# Patient Record
Sex: Female | Born: 1990 | Race: White | Hispanic: No | Smoking: Current every day smoker
Health system: Southern US, Community
[De-identification: ages and names within clinical notes are randomized; demographics above are authoritative.]

## PROBLEM LIST (undated history)

## (undated) DIAGNOSIS — R7303 Prediabetes: Secondary | ICD-10-CM

## (undated) DIAGNOSIS — K589 Irritable bowel syndrome without diarrhea: Secondary | ICD-10-CM

## (undated) DIAGNOSIS — K5900 Constipation, unspecified: Secondary | ICD-10-CM

## (undated) DIAGNOSIS — F419 Anxiety disorder, unspecified: Secondary | ICD-10-CM

## (undated) DIAGNOSIS — K259 Gastric ulcer, unspecified as acute or chronic, without hemorrhage or perforation: Secondary | ICD-10-CM

## (undated) DIAGNOSIS — E282 Polycystic ovarian syndrome: Secondary | ICD-10-CM

## (undated) DIAGNOSIS — F332 Major depressive disorder, recurrent severe without psychotic features: Secondary | ICD-10-CM

## (undated) DIAGNOSIS — K219 Gastro-esophageal reflux disease without esophagitis: Secondary | ICD-10-CM

## (undated) DIAGNOSIS — N912 Amenorrhea, unspecified: Secondary | ICD-10-CM

## (undated) DIAGNOSIS — N926 Irregular menstruation, unspecified: Secondary | ICD-10-CM

## (undated) HISTORY — DX: Anxiety disorder, unspecified: F41.9

## (undated) HISTORY — DX: Irregular menstruation, unspecified: N92.6

## (undated) HISTORY — PX: CHOLECYSTECTOMY: SHX55

## (undated) HISTORY — DX: Amenorrhea, unspecified: N91.2

---
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
## 2008-02-02 ENCOUNTER — Other Ambulatory Visit: Admission: RE | Admit: 2008-02-02 | Discharge: 2008-02-02 | Payer: Self-pay | Admitting: Family Medicine

## 2011-04-01 ENCOUNTER — Encounter: Payer: Self-pay | Admitting: Physician Assistant

## 2011-04-01 ENCOUNTER — Ambulatory Visit: Payer: Self-pay | Admitting: Emergency Medicine

## 2011-04-01 VITALS — BP 112/80 | HR 93 | Temp 98.2°F | Resp 16 | Ht 60.5 in | Wt 172.0 lb

## 2011-04-01 DIAGNOSIS — G43909 Migraine, unspecified, not intractable, without status migrainosus: Secondary | ICD-10-CM

## 2011-04-01 DIAGNOSIS — G5710 Meralgia paresthetica, unspecified lower limb: Secondary | ICD-10-CM

## 2011-04-01 DIAGNOSIS — R200 Anesthesia of skin: Secondary | ICD-10-CM

## 2011-04-01 DIAGNOSIS — G5711 Meralgia paresthetica, right lower limb: Secondary | ICD-10-CM

## 2011-04-01 DIAGNOSIS — R209 Unspecified disturbances of skin sensation: Secondary | ICD-10-CM

## 2011-04-01 NOTE — Patient Instructions (Signed)
Meralgia Paresthetica  Meralgia paresthetica (MP) is a disorder characterized by tingling, numbness, and burning pain in the outer side of the thigh. It occurs in men more than women. MP is generally found in middle-aged or overweight people. Sometimes, the disorder may disappear. CAUSES The disorder is caused by a nerve in the thigh being squeezed (compressed). MP may be associated with tight clothing, pregnancy, diabetes, and being overweight (obese). SYMPTOMS  Tingling, numbness, and burning in the outer thigh.   An area of the skin may be painful and sensitive to the touch.  The symptoms often worsen after walking or standing. TREATMENT  Treatment is based on your symptoms and is mainly supportive. Treatment may include:  Wearing looser clothing.   Losing weight.   Avoiding prolonged standing or walking.   Taking medication.   Surgery if the pain is peristent or severe.  MP usually eases or disappears after treatment. Surgery is not always fully successful. Document Released: 01/09/2002 Document Revised: 10/01/2010 Document Reviewed: 01/19/2005 [facility] Patient Information 2012 [place], [place].

## 2011-04-01 NOTE — Progress Notes (Signed)
  Subjective:    Patient ID: Donna Cisneros, female    DOB: 01/02/1991, 21 y.o.   MRN: [id number]  Extremity Weakness  This is a new problem. The current episode started 1 to 4 weeks ago. There has been no history of extremity trauma. The problem occurs intermittently. The problem has been unchanged. Associated symptoms include numbness.      Review of Systems  Constitutional: Negative.   HENT: Negative.   Eyes: Negative.   Cardiovascular: Negative.   Gastrointestinal: Negative.   Musculoskeletal: Positive for extremity weakness. Negative for joint swelling, arthralgias and gait problem.  Neurological: Positive for numbness. Negative for seizures, light-headedness and headaches.       Objective:   Physical Exam  Constitutional: She is oriented to person, place, and time. She appears well-developed and well-nourished.  Neck: Neck supple. No JVD present. No tracheal deviation present. No thyromegaly present.  Cardiovascular: Normal rate and normal heart sounds.   Pulmonary/Chest: Breath sounds normal.  Lymphadenopathy:    She has no cervical adenopathy.  Neurological: She is alert and oriented to person, place, and time. She has normal reflexes. She displays normal reflexes. No cranial nerve deficit. She exhibits normal muscle tone. Coordination normal.       There is decreased sensation to fine touch lateral right mid to lower thigh.          Assessment & Plan:

## 2011-09-24 ENCOUNTER — Encounter (HOSPITAL_COMMUNITY): Payer: Self-pay | Admitting: Emergency Medicine

## 2011-09-24 ENCOUNTER — Emergency Department (HOSPITAL_COMMUNITY): Payer: BC Managed Care – PPO

## 2011-09-24 ENCOUNTER — Emergency Department (HOSPITAL_COMMUNITY)
Admission: EM | Admit: 2011-09-24 | Discharge: 2011-09-24 | Disposition: A | Discharge status: Home / Self Care | Payer: BC Managed Care – PPO | Attending: Emergency Medicine | Admitting: Emergency Medicine

## 2011-09-24 DIAGNOSIS — K219 Gastro-esophageal reflux disease without esophagitis: Secondary | ICD-10-CM | POA: Insufficient documentation

## 2011-09-24 DIAGNOSIS — F17200 Nicotine dependence, unspecified, uncomplicated: Secondary | ICD-10-CM | POA: Insufficient documentation

## 2011-09-24 DIAGNOSIS — R1013 Epigastric pain: Secondary | ICD-10-CM

## 2011-09-24 HISTORY — DX: Gastro-esophageal reflux disease without esophagitis: K21.9

## 2011-09-24 LAB — URINALYSIS, ROUTINE W REFLEX MICROSCOPIC
Ketones, ur: NEGATIVE mg/dL
Leukocytes, UA: NEGATIVE
Nitrite: NEGATIVE
pH: 5.5 (ref 5.0–8.0)

## 2011-09-24 LAB — CBC WITH DIFFERENTIAL/PLATELET
Basophils Absolute: 0.1 10*3/uL (ref 0.0–0.1)
Basophils Relative: 0 % (ref 0–1)
MCHC: 33.9 g/dL (ref 30.0–36.0)
Monocytes Absolute: 0.8 10*3/uL (ref 0.1–1.0)
Neutro Abs: 9.2 10*3/uL — ABNORMAL HIGH (ref 1.7–7.7)
Neutrophils Relative %: 69 % (ref 43–77)
Platelets: 396 10*3/uL (ref 150–400)
RDW: 12.7 % (ref 11.5–15.5)
WBC: 13.5 10*3/uL — ABNORMAL HIGH (ref 4.0–10.5)

## 2011-09-24 LAB — BASIC METABOLIC PANEL
Chloride: 108 mEq/L (ref 96–112)
Creatinine, Ser: 0.85 mg/dL (ref 0.50–1.10)
GFR calc Af Amer: >90 mL/min (ref >90)
Potassium: 3.9 mEq/L (ref 3.5–5.1)
Sodium: 142 mEq/L (ref 135–145)

## 2011-09-24 MED ORDER — HYDROCODONE-ACETAMINOPHEN 5-500 MG PO TABS: 1.0000 | ORAL_TABLET | Freq: Four times a day (QID) | ORAL | Status: AC | PRN

## 2011-09-24 MED ORDER — OMEPRAZOLE 20 MG PO CPDR: 20.0000 mg | DELAYED_RELEASE_CAPSULE | Freq: Two times a day (BID) | ORAL | Status: DC

## 2011-09-24 NOTE — ED Provider Notes (Signed)
History     CSN: [id number]  Arrival date & time 09/24/11  1320   First MD Initiated Contact with Patient 09/24/11 1740      Chief Complaint  Patient presents with  . Abdominal Pain    (Consider location/radiation/quality/duration/timing/severity/associated sxs/prior treatment) HPI Comments: Has been on meds for reflux in the past, now having more discomfort in the ruq, epigastrium.  This is worse with eating.  There are no fevers or chills.    Patient is a 21 y.o. female presenting with abdominal pain. The history is provided by the patient.  Abdominal Pain The primary symptoms of the illness include abdominal pain and nausea. The primary symptoms of the illness do not include fever, vomiting, diarrhea, dysuria, vaginal discharge or vaginal bleeding. Episode onset: 3 months ago. The onset of the illness was gradual. The problem has been gradually worsening.  The illness is associated with eating. The patient states that she believes she is currently not pregnant. The patient has not had a change in bowel habit. Symptoms associated with the illness do not include chills.    Past Medical History  Diagnosis Date  . Acid reflux     History reviewed. No pertinent past surgical history.  History reviewed. No pertinent family history.  History  Substance Use Topics  . Smoking status: Current Everyday Smoker  . Smokeless tobacco: Not on file  . Alcohol Use: Yes     occasional    OB History    Grav Para Term Preterm Abortions TAB SAB Ect Mult Living                  Review of Systems  Constitutional: Negative for fever and chills.  Gastrointestinal: Positive for nausea and abdominal pain. Negative for vomiting and diarrhea.  Genitourinary: Negative for dysuria, vaginal bleeding and vaginal discharge.  All other systems reviewed and are negative.    Allergies  Review of patient's allergies indicates no known allergies.  Home Medications   Current Outpatient Rx  Name  Route Sig Dispense Refill  . OVER THE COUNTER MEDICATION Oral Take 3 tablets by mouth at bedtime as needed. Coffee Cruda-for sleep    . RIZATRIPTAN BENZOATE 10 MG PO TBDP Oral Take 10 mg by mouth as needed. May repeat in 2 hours if needed      BP 120/71  Pulse 93  Temp 98.3 F (36.8 C) (Oral)  Resp 16  SpO2 98%  Physical Exam  Nursing note and vitals reviewed. Constitutional: She is oriented to person, place, and time. She appears well-developed and well-nourished. No distress.  HENT:  Head: Normocephalic and atraumatic.  Neck: Normal range of motion. Neck supple.  Cardiovascular: Normal rate and regular rhythm.  Exam reveals no gallop and no friction rub.   No murmur heard. Pulmonary/Chest: Effort normal and breath sounds normal. No respiratory distress. She has no wheezes.  Abdominal: Soft. Bowel sounds are normal. She exhibits no distension.       There is ttp in the epigastrium, ruq without rebound or guarding.    Musculoskeletal: Normal range of motion.  Neurological: She is alert and oriented to person, place, and time.  Skin: Skin is warm and dry. She is not diaphoretic.    ED Course  Procedures (including critical care time)  Labs Reviewed  CBC WITH DIFFERENTIAL - Abnormal; Notable for the following:    WBC 13.5 (*)     Neutro Abs 9.2 (*)     All other components within normal limits  BASIC METABOLIC PANEL - Abnormal; Notable for the following:    Glucose, Bld 101 (*)     All other components within normal limits  LIPASE, BLOOD  POCT PREGNANCY, URINE  URINALYSIS, ROUTINE W REFLEX MICROSCOPIC   No results found.   No diagnosis found.    MDM  The patient presents with epigastric and ruq pain.  The ultrasound is negative.  As she has been on zantac and no longer seems to be working, I will switch her to prilosec, to return prn if worsens.        Geoffery Lyons, MD 09/24/11 [id number]

## 2011-09-24 NOTE — ED Notes (Signed)
Pt c/o upper abd pain x 3 months that feels like worsening acid reflux; pt sts increased burping after eating

## 2013-01-12 ENCOUNTER — Other Ambulatory Visit: Payer: Self-pay | Admitting: Gastroenterology

## 2013-01-12 DIAGNOSIS — R1033 Periumbilical pain: Secondary | ICD-10-CM

## 2013-01-12 DIAGNOSIS — R112 Nausea with vomiting, unspecified: Secondary | ICD-10-CM

## 2013-01-24 ENCOUNTER — Encounter: Payer: Self-pay | Admitting: Gynecology

## 2013-01-24 ENCOUNTER — Ambulatory Visit (INDEPENDENT_AMBULATORY_CARE_PROVIDER_SITE_OTHER): Payer: BC Managed Care – PPO | Admitting: Gynecology

## 2013-01-24 VITALS — BP 108/68 | HR 64 | Resp 18 | Ht 60.75 in | Wt 176.0 lb

## 2013-01-24 DIAGNOSIS — N912 Amenorrhea, unspecified: Secondary | ICD-10-CM

## 2013-01-24 DIAGNOSIS — N926 Irregular menstruation, unspecified: Secondary | ICD-10-CM | POA: Insufficient documentation

## 2013-01-24 HISTORY — DX: Irregular menstruation, unspecified: N92.6

## 2013-01-24 HISTORY — DX: Amenorrhea, unspecified: N91.2

## 2013-01-24 NOTE — Progress Notes (Signed)
22 y.o. Married Caucasian female   [id number] referred form PCP for irregular menses.  Pt states she bled 10/13-6/14 mostly every day.  Variable flow-red.  Pt was not seen by MD due to no insurance at that time.  Pt reports lifelong history of irregular cycles- usually q75m, next and last cycle was October 2014.  Pt bled 5-7d, normal flow.  Pt has only used condoms for contraception.  Pt has never been pregnant.   Pt is  currently sexually active.  Married 2y. She reports not using condoms on a regular basis.  First sexual activity at 22 years old, 79 number of lifetime partners.   Pt reports 50# weight gain a few years ago but believes she has been mostly stable since.  She denies nipple discharge, headaches. Pt has had issues with excessive gas, indigestion.    Patient's last menstrual period was 11/02/2012.          Sexually active: yes  The current method of family planning is none.    Exercising: no  The patient does not participate in regular exercise at present. Last pap: 12/114 Negative  Alcohol: socially Tobacco:  5-7 cigs/day Drugs: no Gardisil: no, completed: started it   Labs: PCP   Health Maintenance  Topic Date Due  . Pap Smear  01/22/2007  . Tetanus/tdap  01/22/2008  . Influenza Vaccine  09/02/2012    Family History  Problem Relation Age of Onset  . Heart disease Maternal Grandfather     Patient Active Problem List   Diagnosis Date Noted  . Acid reflux   . Migraines 04/01/2011    Past Medical History  Diagnosis Date  . Acid reflux   . Anxiety     History reviewed. No pertinent past surgical history.  Allergies: Review of patient's allergies indicates no known allergies.  Current Outpatient Prescriptions  Medication Sig Dispense Refill  . FIBER PO Take by mouth.      . Probiotic Product (PROBIOTIC PO) Take by mouth.      . rizatriptan (MAXALT-MLT) 10 MG disintegrating tablet Take 10 mg by mouth as needed. May repeat in 2 hours if needed      . traMADol  (ULTRAM) 50 MG tablet Take by mouth every 6 (six) hours as needed.      Marland Kitchen omeprazole (PRILOSEC) 20 MG capsule Take 1 capsule (20 mg total) by mouth 2 (two) times daily.  30 capsule  1  . OVER THE COUNTER MEDICATION Take 3 tablets by mouth at bedtime as needed. Coffee Cruda-for sleep       No current facility-administered medications for this visit.    ROS: Pertinent items are noted in HPI.  Exam:    BP 108/68  Pulse 64  Resp 18  Ht 5' 0.75" (1.543 m)  Wt 176 lb (79.833 kg)  BMI 33.53 kg/m2  LMP 11/02/2012 Weight change: @WEIGHTCHANGE @ Last 3 height recordings:  Ht Readings from Last 3 Encounters:  01/24/13 5' 0.75" (1.543 m)  04/01/11 5' 0.5" (1.537 m)   General appearance: alert, cooperative and appears stated age Head: Normocephalic, without obvious abnormality, atraumatic Neck: no adenopathy, no carotid bruit, no JVD, supple, symmetrical, trachea midline and thyroid not enlarged, symmetric, no tenderness/mass/nodules Lungs: clear to auscultation bilaterally Breasts: normal appearance, no masses or tenderness Heart: regular rate and rhythm, S1, S2 normal, no murmur, click, rub or gallop Abdomen: soft, non-tender; bowel sounds normal; no masses,  no organomegaly Extremities: extremities normal, atraumatic, no cyanosis or edema Skin: Skin color, texture,  turgor normal. No rashes or lesions Lymph nodes: Cervical, supraclavicular, and axillary nodes normal. no inguinal nodes palpated Neurologic: Grossly normal   Pelvic: External genitalia:  no lesions              Urethra: normal appearing urethra with no masses, tenderness or lesions              Bartholins and Skenes: Bartholin's, Urethra, Skene's normal                 Vagina: normal appearing vagina with normal color and discharge, no lesions              Cervix: normal appearance              Pap taken: no        Bimanual Exam:  Uterus:  uterus is normal size, shape, consistency and nontender                                       Adnexa:    no masses                                      Rectovaginal: Deferred                                      Anus:  defer exam  A:Irregular menses with menometrorrhagia obesity Contraceptive management     P: records reviewed with pt and husband, of note pt has elevated TG and low HDL, in addition her fasting glucose is 91. Normal TSH We had a long discussion regarding her DUB, she was never evaluated for her prolonged bleeding of 72m last year but seems to have stopped without treatment.  We discussed the risk of endometrial dysplasia related to prolonged periods of anovulation.  We discussed PCOS and metabolic syndrome but have not confirmed either of these diagnoses. They are not interested in conception immediately but will be in the upcoming years. We will get a PUS, FSH/LH ratio, and quant.  Recommend using condoms for now so we can start ocp  We discussed treating with ocp after full evaluation Questions addressed Discussed STD prevention, regular condom use.     An After Visit Summary was printed and given to the patient.

## 2013-01-24 NOTE — Patient Instructions (Signed)
Use condoms for now until ocp can be started

## 2013-01-25 ENCOUNTER — Other Ambulatory Visit (INDEPENDENT_AMBULATORY_CARE_PROVIDER_SITE_OTHER): Payer: BC Managed Care – PPO

## 2013-01-25 DIAGNOSIS — N926 Irregular menstruation, unspecified: Secondary | ICD-10-CM

## 2013-01-25 LAB — FSH/LH: FSH: 4.5 m[IU]/mL

## 2013-01-25 LAB — ESTRADIOL: Estradiol: 59.4 pg/mL

## 2013-01-25 LAB — GLUCOSE, RANDOM: Glucose, Bld: 91 mg/dL (ref 70–99)

## 2013-01-26 LAB — INSULIN, FASTING: Insulin fasting, serum: 16 u[IU]/mL (ref 3–28)

## 2013-01-30 ENCOUNTER — Telehealth: Payer: Self-pay | Admitting: Gynecology

## 2013-01-30 NOTE — Telephone Encounter (Signed)
Patient returning call to [place]

## 2013-01-30 NOTE — Telephone Encounter (Signed)
No answer on home phone #, female voice on cell phone voicemail. No message left. Called patient to schedule PUS.

## 2013-01-31 ENCOUNTER — Ambulatory Visit (HOSPITAL_COMMUNITY)
Admission: RE | Admit: 2013-01-31 | Discharge: 2013-01-31 | Disposition: A | Discharge status: Home / Self Care | Payer: BC Managed Care – PPO | Source: Ambulatory Visit | Attending: Gastroenterology | Admitting: Gastroenterology

## 2013-01-31 DIAGNOSIS — R112 Nausea with vomiting, unspecified: Secondary | ICD-10-CM | POA: Insufficient documentation

## 2013-01-31 DIAGNOSIS — R1033 Periumbilical pain: Secondary | ICD-10-CM | POA: Insufficient documentation

## 2013-01-31 MED ORDER — TECHNETIUM TC 99M MEBROFENIN IV KIT
5.0000 | PACK | Freq: Once | INTRAVENOUS | Status: AC | PRN
  Administered 2013-01-31: 5 via INTRAVENOUS

## 2013-02-01 ENCOUNTER — Telehealth: Payer: Self-pay | Admitting: Gynecology

## 2013-02-01 NOTE — Telephone Encounter (Signed)
Spoke with patient. She states that she was calling to update Dr. Farrel Gobble. Has testing done yesterday and Gallbladder is not functioning properly per Patient. Testing was done yesterday ordered by Dr. Loreta Ave. She states that Dr. Kenna Gilbert office called her to let her know and they would be putting her in contact with the surgeon. Patient states she will call back with update.

## 2013-02-01 NOTE — Telephone Encounter (Signed)
Patient says she was told to call with info re: "stomach issues".

## 2013-02-07 ENCOUNTER — Ambulatory Visit (INDEPENDENT_AMBULATORY_CARE_PROVIDER_SITE_OTHER): Payer: BC Managed Care – PPO | Admitting: Gynecology

## 2013-02-07 ENCOUNTER — Encounter: Payer: Self-pay | Admitting: Gynecology

## 2013-02-07 ENCOUNTER — Ambulatory Visit (INDEPENDENT_AMBULATORY_CARE_PROVIDER_SITE_OTHER): Payer: BC Managed Care – PPO

## 2013-02-07 VITALS — BP 108/68 | HR 62 | Resp 16 | Ht 60.75 in | Wt 176.0 lb

## 2013-02-07 DIAGNOSIS — N926 Irregular menstruation, unspecified: Secondary | ICD-10-CM

## 2013-02-07 DIAGNOSIS — E282 Polycystic ovarian syndrome: Secondary | ICD-10-CM

## 2013-02-07 DIAGNOSIS — K829 Disease of gallbladder, unspecified: Secondary | ICD-10-CM

## 2013-02-07 NOTE — Progress Notes (Signed)
      Pt here with spouse to review u/s done for menstrual irregularities and to discuss her labs.  Images were reviewed.   Ovaries appear c/w PCOS, possible dominant follicle on left,lining 8.891mm, no free fluid. Pt's labs were notable for elevated LH 9.0, FSH 4.5, glucose normal 91 and insulin 16. We discussed PCOS at length.  Ideally she would benefit with ovarian suppression with ocp.  She was referred to GI for excessive burping at our last office visit and had a HEPA scan that shown decreased motility of gallbladder but no stones, she is being referred to gen surgery. We suggest that she use condoms for now as ocp may exacerbate her GB and that after surgery, we can consider starting her on ocp. Questions were addressed. 1466m spent, >50% face to face discussing PCOS

## 2013-02-10 ENCOUNTER — Encounter (INDEPENDENT_AMBULATORY_CARE_PROVIDER_SITE_OTHER): Payer: Self-pay | Admitting: Surgery

## 2013-02-27 ENCOUNTER — Encounter (INDEPENDENT_AMBULATORY_CARE_PROVIDER_SITE_OTHER): Payer: Self-pay

## 2013-02-27 ENCOUNTER — Encounter (INDEPENDENT_AMBULATORY_CARE_PROVIDER_SITE_OTHER): Payer: Self-pay | Admitting: Surgery

## 2013-02-27 ENCOUNTER — Ambulatory Visit (INDEPENDENT_AMBULATORY_CARE_PROVIDER_SITE_OTHER): Payer: BC Managed Care – PPO | Admitting: Surgery

## 2013-02-27 VITALS — BP 116/61 | HR 70 | Temp 98.6°F | Resp 18 | Ht 61.0 in | Wt 175.2 lb

## 2013-02-27 DIAGNOSIS — K828 Other specified diseases of gallbladder: Secondary | ICD-10-CM

## 2013-02-27 NOTE — Progress Notes (Signed)
Patient ID: Donna Cisneros, female   DOB: Dec 03, 1990, 22 y.o.   MRN: [id number]  Chief Complaint  Patient presents with  . Abdominal Pain    HPI Donna Cisneros is a 23 y.o. female.   HPI This is a pleasant female referred to me by Dr. Loreta Ave for multiple abdominal complaints. She has had severe reflux, burning epigastric abdominal pain, pain in her increase her back, occasional nausea and vomiting, etc. For many years. She has had ulcers in the past. She has had an extensive workup including an upper endoscopy. Her ultrasound has been negative in the past for stones. She has had anorexia and bulimia in the past according to her. Her symptoms occur with just about eating she eats. She also has intermittent loose bowel movements. Past Medical History  Diagnosis Date  . Acid reflux   . Anxiety   . Irregular menstrual cycle 01/24/2013  . Amenorrhea 01/24/2013    History reviewed. No pertinent past surgical history.  Family History  Problem Relation Age of Onset  . Heart disease Maternal Grandfather     Social History History  Substance Use Topics  . Smoking status: Current Every Day Smoker -- 0.25 packs/day    Types: Cigarettes  . Smokeless tobacco: Not on file  . Alcohol Use: No    No Known Allergies  Current Outpatient Prescriptions  Medication Sig Dispense Refill  . azithromycin (ZITHROMAX) 250 MG tablet       . FIBER PO Take by mouth.      . fluconazole (DIFLUCAN) 150 MG tablet       . OVER THE COUNTER MEDICATION Take 3 tablets by mouth at bedtime as needed. Coffee Cruda-for sleep      . pantoprazole (PROTONIX) 40 MG tablet       . Probiotic Product (PROBIOTIC PO) Take by mouth.      . promethazine (PHENERGAN) 25 MG tablet       . rizatriptan (MAXALT-MLT) 10 MG disintegrating tablet Take 10 mg by mouth as needed. May repeat in 2 hours if needed      . traMADol (ULTRAM) 50 MG tablet Take by mouth every 6 (six) hours as needed.      Marland Kitchen omeprazole (PRILOSEC) 20 MG  capsule Take 1 capsule (20 mg total) by mouth 2 (two) times daily.  30 capsule  1   No current facility-administered medications for this visit.    Review of Systems Review of Systems  Constitutional: Negative for fever, chills and unexpected weight change.  HENT: Negative for congestion, hearing loss, sore throat, trouble swallowing and voice change.   Eyes: Negative for visual disturbance.  Respiratory: Negative for cough and wheezing.   Cardiovascular: Negative for chest pain, palpitations and leg swelling.  Gastrointestinal: Positive for nausea, vomiting, abdominal pain, diarrhea and abdominal distention. Negative for constipation, blood in stool and anal bleeding.  Genitourinary: Negative for hematuria, vaginal bleeding and difficulty urinating.  Musculoskeletal: Negative for arthralgias.  Skin: Negative for rash and wound.  Neurological: Negative for seizures, syncope and headaches.  Hematological: Negative for adenopathy. Does not bruise/bleed easily.  Psychiatric/Behavioral: Negative for confusion.    Blood pressure 116/61, pulse 70, temperature 98.6 F (37 C), temperature source Temporal, resp. rate 18, height 5\' 1"  (1.549 m), weight 175 lb 3.2 oz (79.47 kg).  Physical Exam Physical Exam  Constitutional: She is oriented to person, place, and time. She appears well-developed and well-nourished. No distress.  obese  HENT:  Head: Normocephalic and atraumatic.  Right  Ear: External ear normal.  Left Ear: External ear normal.  Nose: Nose normal.  Mouth/Throat: Oropharynx is clear and moist. No oropharyngeal exudate.  Eyes: Conjunctivae are normal. Pupils are equal, round, and reactive to light. Right eye exhibits no discharge. Left eye exhibits no discharge. No scleral icterus.  Neck: Normal range of motion. Neck supple. No tracheal deviation present. No thyromegaly present.  Cardiovascular: Normal rate, regular rhythm, normal heart sounds and intact distal pulses.   No murmur  heard. Pulmonary/Chest: Effort normal and breath sounds normal. No respiratory distress. She has no wheezes. She has no rales.  Abdominal: Soft. Bowel sounds are normal. She exhibits no distension. There is no tenderness. There is no rebound.  Musculoskeletal: Normal range of motion. She exhibits no edema and no tenderness.  Lymphadenopathy:    She has no cervical adenopathy.  Neurological: She is alert and oriented to person, place, and time.  Skin: Skin is warm and dry. She is not diaphoretic. No erythema.  Psychiatric: Her behavior is normal.    Data Reviewed I have reviewed all her labs and x-ray data. Her gallbladder ejection fraction is 28%. Her upper endoscopy does show tiny gastric erosions  Assessment    Biliary dyskinesia     Plan    I do believe she has chronic cholecystitis which may be contributing to her abdominal complaints with the dyskinesia. Not all of her symptoms may be related to the gallbladder but I believe laparoscopic cholecystectomy is warranted. I discussed the surgical procedure with her in detail. I discussed the risks which includes but is not limited to bleeding, infection, bile duct injury, bile leak, injury to other structures, need to convert to an open procedure, the chance this may not resolve any of her symptoms, etc. She understands and wishes to proceed. I also discussed postoperative recovery. Cholecystectomy will be scheduled        Yazid Pop A 02/27/2013, 1:56 PM

## 2013-03-07 ENCOUNTER — Other Ambulatory Visit (INDEPENDENT_AMBULATORY_CARE_PROVIDER_SITE_OTHER): Payer: Self-pay | Admitting: Surgery

## 2013-03-07 ENCOUNTER — Other Ambulatory Visit (INDEPENDENT_AMBULATORY_CARE_PROVIDER_SITE_OTHER): Payer: Self-pay | Admitting: *Deleted

## 2013-03-07 ENCOUNTER — Telehealth (INDEPENDENT_AMBULATORY_CARE_PROVIDER_SITE_OTHER): Payer: Self-pay | Admitting: General Surgery

## 2013-03-07 DIAGNOSIS — K810 Acute cholecystitis: Secondary | ICD-10-CM

## 2013-03-07 DIAGNOSIS — K824 Cholesterolosis of gallbladder: Secondary | ICD-10-CM

## 2013-03-07 MED ORDER — HYDROCODONE-ACETAMINOPHEN 5-325 MG PO TABS: 1.0000 | ORAL_TABLET | ORAL | Status: DC | PRN

## 2013-03-07 NOTE — Telephone Encounter (Signed)
Pt called after undergoing LapChole today stating her right abdomen and part of side & just wanted to make sure that was normal. No f/c/n/v. Tolerating food. vicodin relieves pain. Explained that is typical complaint after surgery and should resolve with time. Advised to cont to take pain med as needed and call if symptoms worsen, don't get relief with pain med or if other issues come up

## 2013-03-14 ENCOUNTER — Telehealth (INDEPENDENT_AMBULATORY_CARE_PROVIDER_SITE_OTHER): Payer: Self-pay

## 2013-03-14 NOTE — Telephone Encounter (Signed)
Husband asking for refill of Vicodin 5/325 mg rates pain 7, ultram on hand w/o relief ; message sent to DR. Magnus IvanBlackman

## 2013-03-15 NOTE — Telephone Encounter (Signed)
Patient aware Hydrocodone 5/325mg  1-2-po q 4hrs Prn/pain is at front desk for pick up per Dr. Magnus IvanBlackman

## 2013-03-18 ENCOUNTER — Telehealth (INDEPENDENT_AMBULATORY_CARE_PROVIDER_SITE_OTHER): Payer: Self-pay | Admitting: General Surgery

## 2013-03-18 NOTE — Telephone Encounter (Signed)
Pt called stating she had 2 "bumps" under her skin of the incisions.  There are not very red and are not draining at all.  They are not tender to touch.  I told her that it sounds like she is feeling the suture knot.  She will call the office if she develops any other symptoms.

## 2013-03-20 ENCOUNTER — Telehealth: Payer: Self-pay | Admitting: Gynecology

## 2013-03-20 NOTE — Telephone Encounter (Signed)
Spoke with patient. She complains of  brown spotting since Thursday 2/19. She states "its not actually bleeding, but its spotting brown when I wipe".  LMP 02/16/13 per patient, 4 days of normal flow and two days of light flow per patient. Patient had surgery on 2/3 for laparoscopic cholecystectomy. Patient has been doing well per patient since surgery, taking Zegrid and Tramadol. Denies fevers or pain.   Patient is using condoms for sexual activity. I advised patient will need otc pregnancy test and patient states "I really doubt that I am pregnant".  I advised that we can watch the spotting, see if worsens or improves. Advised I would send a message to Dr. Farrel GobbleLathrop for any further advice. Patient advised to call back if any worsening or ongoing symptoms.

## 2013-03-20 NOTE — Telephone Encounter (Signed)
Message from Dr. Farrel GobbleLathrop given. Patient agreeable. Appointment scheduled for 2/18 for ocp start appt.

## 2013-03-20 NOTE — Telephone Encounter (Signed)
Patient calling saying she has been having some brown spotting since Thursday of last week.

## 2013-03-20 NOTE — Telephone Encounter (Signed)
I agree that a pregnancy test would be appropriate, we can watch for now if she doesn't want to do one but she was going to follow up to get started on ocp after her GB surgery.

## 2013-03-21 ENCOUNTER — Encounter (INDEPENDENT_AMBULATORY_CARE_PROVIDER_SITE_OTHER): Payer: Self-pay

## 2013-03-22 ENCOUNTER — Encounter: Payer: Self-pay | Admitting: Gynecology

## 2013-03-22 ENCOUNTER — Ambulatory Visit (INDEPENDENT_AMBULATORY_CARE_PROVIDER_SITE_OTHER): Payer: BC Managed Care – PPO | Admitting: Gynecology

## 2013-03-22 VITALS — BP 97/72 | HR 117 | Resp 12 | Ht 61.0 in | Wt 170.0 lb

## 2013-03-22 DIAGNOSIS — N912 Amenorrhea, unspecified: Secondary | ICD-10-CM

## 2013-03-22 DIAGNOSIS — N926 Irregular menstruation, unspecified: Secondary | ICD-10-CM

## 2013-03-22 DIAGNOSIS — E282 Polycystic ovarian syndrome: Secondary | ICD-10-CM

## 2013-03-22 LAB — POCT URINE PREGNANCY: Preg Test, Ur: NEGATIVE

## 2013-03-22 MED ORDER — MEDROXYPROGESTERONE ACETATE 5 MG PO TABS: 5.0000 mg | ORAL_TABLET | Freq: Every day | ORAL | Status: DC

## 2013-03-22 MED ORDER — LEVONORGESTREL-ETHINYL ESTRAD 0.15-30 MG-MCG PO TABS: 1.0000 | ORAL_TABLET | Freq: Every day | ORAL | Status: AC

## 2013-03-22 NOTE — Progress Notes (Signed)
Pt here with her husband to discuss contraception and regulation of her cycles.  Pt just underwent a laparoscopic cholecystectomy for chronic cholecystitis, we had wanted her to have her surgery before considering ocp due to the elevated risk of GB disease. Pt reports that they have not been sexually active for over 3065m and she had a negative upt here in the office today, she was concerned because she began spotting after her surgery. We discussed her options: ocp, implanon, skyla IUD. We believe she has PCOS with elevated LH/FSH ratio of 2:1 and that of her options, ocp would be the best, she had normal fasting insulin and glucose levels.  She has had lifetime periods of prolonged bleeding and amenorrhea. ocp would best regulate them and protect against endometrial hyperplasia. They agree We will start her on provera to bring on her cycle and she will then start the ocp the first day of flow, bleeding patterns reviewed and accepted, questions addressed 3383m spent discussing risks of prolonged irregular cycles and contraceptive options. >50% face to face

## 2013-03-24 ENCOUNTER — Ambulatory Visit (INDEPENDENT_AMBULATORY_CARE_PROVIDER_SITE_OTHER): Payer: BC Managed Care – PPO | Admitting: Surgery

## 2013-03-24 ENCOUNTER — Encounter (INDEPENDENT_AMBULATORY_CARE_PROVIDER_SITE_OTHER): Payer: Self-pay | Admitting: Surgery

## 2013-03-24 ENCOUNTER — Telehealth: Payer: Self-pay | Admitting: Gynecology

## 2013-03-24 VITALS — BP 100/62 | HR 70 | Resp 16 | Ht 61.0 in | Wt 169.0 lb

## 2013-03-24 DIAGNOSIS — Z09 Encounter for follow-up examination after completed treatment for conditions other than malignant neoplasm: Secondary | ICD-10-CM

## 2013-03-24 NOTE — Telephone Encounter (Signed)
Pt calling to discuss her medication with the nurse

## 2013-03-24 NOTE — Telephone Encounter (Signed)
Spoke with patinet. Advised per note from Dr. Farrel GobbleLathrop that she is to take Provera to induce her period. May not have bleeding up until two weeks after stopping the provera, if does not start period then to call us. When she starts her period, she is to start day one of birth control.  Advised if starts period while on provera can dc provera and use pills only.  Patient verbalized understanding and will follow up prn.  Routing to provider for final review. Patient agreeable to disposition. Will close encounter

## 2013-03-24 NOTE — Progress Notes (Signed)
Subjective:     Patient ID: Donna BurlyJulie Ohagan Cisneros, female   DOB: 13-Jul-1990, 23 y.o.   MRN: [id number]  HPI She is here for first postop visit status post laparoscopic cholecystectomy. I believe some of her symptoms have resolved she has ulcer she is still uncertain. She is otherwise eating well moving her bowels well  Review of Systems     Objective:   Physical Exam On exam, her abdomen is soft and nontender. Her incisions are well-healed. The final pathology showed chronic cholecystitis    Assessment:     Patient stable postop     Plan:     She may resume her normal activity. I'll see her back as needed

## 2013-03-27 NOTE — Telephone Encounter (Signed)
Agree-TL

## 2013-04-04 ENCOUNTER — Telehealth: Payer: Self-pay | Admitting: Gynecology

## 2013-04-04 NOTE — Telephone Encounter (Signed)
Pt started her birth control pills today and wondering when she can have intercourse.

## 2013-04-04 NOTE — Telephone Encounter (Signed)
Spoke with pt to advise her to use a BUM like condoms for the first month on OCP to give the med time to get into her system. Advised the pills would be sufficient for birth control with the second pack. Pt agreeable.

## 2013-04-27 NOTE — Telephone Encounter (Signed)
Patient has been taking birth control pills for one month. Started taking sugar pills and has not started her period. Patient states that she has taken her pills daily but has been off schedule by "thirty minutes or so" for a few pills in the pack. Patient states she was only having menses "every three months" and this is why she began taking birth control to begin with. Has not completed full pack of pills yet. Advised that body needs time to adjust to birth control and since pack has not yet been completed she could expect to wait a couple more days for a menses to begin with end of pack. Since menses was irregular prior to starting birth control advised that body will take time to adjust to hormones. Patient was advised to use back up method for birth control within the first month of taking to make sure it gets in her system. Also discussed importance of taking birth control at the same time daily. Offered office visit with Dr.Lathrop to discuss menses irregularities if she still had concerns but patient declined. Patient states that she would like to wait and complete her pack of pills and see if she starts her menses before coming in for an office visit. Patient will call back with any further questions, concerns, or wanting to schedule office visit.   Dr.Lathrop, anything further to add for this patient?

## 2013-04-27 NOTE — Telephone Encounter (Signed)
Patient said she started the sugar pills on Tuesday and she hasnt started her cycle yet.

## 2013-04-28 NOTE — Telephone Encounter (Signed)
If she started the pills the first day of her cycle and not the Sunday after she should be covered contraceptively, if she had a delay in the start, she should use condoms, i'd suggest she take an otc pregnancy test if she does not get her cycle

## 2013-05-01 NOTE — Telephone Encounter (Signed)
Spoke with patient. Patient states that she has started her menses and has no further questions or needs at this time.  Routing to provider for final review. Patient agreeable to disposition. Will close encounter

## 2013-05-09 ENCOUNTER — Telehealth: Payer: Self-pay | Admitting: Hematology and Oncology

## 2013-05-09 NOTE — Telephone Encounter (Signed)
S/W PATIENT AND GAVE NEW PATIENT APPT FOR 04/17 @ 1 W/DR. GORSUCH.  REFERRING DR. Kipp BroodBRENT BURNETT DX- LEUKOCYTOSIS WELCOME PACKET MAILED.

## 2013-05-09 NOTE — Telephone Encounter (Signed)
C/D 05/09/13 for appt. 05/19/13 °

## 2013-05-19 ENCOUNTER — Encounter: Payer: Self-pay | Admitting: Hematology and Oncology

## 2013-05-19 ENCOUNTER — Ambulatory Visit: Payer: BC Managed Care – PPO

## 2013-05-19 ENCOUNTER — Ambulatory Visit (HOSPITAL_BASED_OUTPATIENT_CLINIC_OR_DEPARTMENT_OTHER): Payer: BC Managed Care – PPO | Admitting: Hematology and Oncology

## 2013-05-19 VITALS — BP 125/79 | HR 119 | Temp 98.4°F | Resp 18 | Ht 61.0 in | Wt 165.9 lb

## 2013-05-19 DIAGNOSIS — R05 Cough: Secondary | ICD-10-CM

## 2013-05-19 DIAGNOSIS — F17200 Nicotine dependence, unspecified, uncomplicated: Secondary | ICD-10-CM

## 2013-05-19 DIAGNOSIS — D72829 Elevated white blood cell count, unspecified: Secondary | ICD-10-CM

## 2013-05-19 DIAGNOSIS — R059 Cough, unspecified: Secondary | ICD-10-CM

## 2013-05-19 DIAGNOSIS — Z720 Tobacco use: Secondary | ICD-10-CM

## 2013-05-19 NOTE — Progress Notes (Signed)
Checked in new patient with no financial issues. She has not been out of [place]. °

## 2013-05-20 NOTE — Progress Notes (Signed)
[facility] CON[facility]ULT [facility]  Patient Care Team: Talbot GrumblingSheila C. Creta LevinStallings, MD as PCP - General (Family Medicine)  CHIEF COMPLAINTS/PURPOSE OF CONSULTATION:  Chronic leukocytosis HISTORY OF PRESENTING ILLNESS:  Donna Cisneros 23 y.o. female is here because of elevated WBC.  She was found to have abnormal CBC from routine blood tests. I had the opportunity to review her prior blood work. From August 2013 to present, she had chronic fluctuation of the total white blood cell count from 10.8-13.5. She denies recent infection. The last prescription antibiotics was more than 3 months ago There is not reported symptoms of sinus congestion, cough, urinary frequency/urgency or dysuria, diarrhea, joint swelling/pain or abnormal skin rash.  She had no prior history or diagnosis of cancer. Her age appropriate screening programs are up-to-date. The patient has no prior diagnosis of autoimmune disease and was not prescribed corticosteroids related products.  The patient is a smoker and currently smokes 1/2 pack of cigarettes per day for the last 11 years.  MEDICAL HISTORY:  Past Medical History  Diagnosis Date  . Acid reflux   . Anxiety   . Irregular menstrual cycle 01/24/2013  . Amenorrhea 01/24/2013    SURGICAL HISTORY: Past Surgical History  Procedure Laterality Date  . Cholecystectomy      SOCIAL HISTORY: History   Social History  . Marital Status: Married    Spouse Name: N/A    Number of Children: N/A  . Years of Education: N/A   Occupational History  . Not on file.   Social History Main Topics  . Smoking status: Current Every Day Smoker -- 0.50 packs/day for 11 years    Types: Cigarettes  . Smokeless tobacco: Never Used  . Alcohol Use: No  . Drug Use: No  . Sexual Activity: Not on file   Other Topics Concern  . Not on file   Social History Narrative  . No narrative on file    FAMILY HISTORY: Family History  Problem Relation Age of Onset  . Heart  disease Maternal Grandfather     ALLERGIES:  has No Known Allergies.  MEDICATIONS:  Current Outpatient Prescriptions  Medication Sig Dispense Refill  . FIBER PO Take by mouth.      Marland Kitchen. HYDROcodone-acetaminophen (NORCO) 5-325 MG per tablet Take 1-2 tablets by mouth every 4 (four) hours as needed for moderate pain (Given at discharge from [facility]).  30 tablet  0  . levonorgestrel-ethinyl estradiol (NORDETTE) 0.15-30 MG-MCG tablet Take 1 tablet by mouth daily.  1 Package  12  . omeprazole-sodium bicarbonate (ZEGERID) 40-1100 MG per capsule Take 1 capsule by mouth daily before breakfast.      . OVER THE COUNTER MEDICATION Take 3 tablets by mouth at bedtime as needed. Coffee Cruda-for sleep      . Probiotic Product (PROBIOTIC PO) Take by mouth.      . SUMAtriptan (IMITREX) 50 MG tablet Take 50 mg by mouth every 2 (two) hours as needed for migraine or headache (max 4 tabs per day). May repeat in 2 hours if headache persists or recurs.      . traMADol (ULTRAM) 50 MG tablet Take by mouth every 6 (six) hours as needed.       No current facility-administered medications for this visit.    REVIEW OF SYSTEMS:   Constitutional: Denies fevers, chills or abnormal night sweats Eyes: Denies blurriness of vision, double vision or watery eyes Ears, nose, mouth, throat, and face: Denies mucositis or sore throat Respiratory: She has chronic  productive of white sputum but denies dyspnea or wheezes Cardiovascular: Denies palpitation, chest discomfort or lower extremity swelling Gastrointestinal:  Denies nausea, heartburn or change in bowel habits Skin: Denies abnormal skin rashes Lymphatics: Denies new lymphadenopathy or easy bruising Neurological:Denies numbness, tingling or new weaknesses Behavioral/Psych: Mood is stable, no new changes  She complains of chronic back pain and wrist pain. All other systems were reviewed with the patient and are negative.  PHYSICAL EXAMINATION: ECOG PERFORMANCE STATUS: 1 -  Symptomatic but completely ambulatory  Filed Vitals:   05/19/13 1331  BP: 125/79  Pulse: 119  Temp: 98.4 F (36.9 C)  Resp: 18   Filed Weights   05/19/13 1331  Weight: 165 lb 14.4 oz (75.252 kg)    GENERAL:alert, no distress and comfortable. She is morbidly obese SKIN: skin color, texture, turgor are normal, no rashes or significant lesions EYES: normal, conjunctiva are pink and non-injected, sclera clear OROPHARYNX:no exudate, no erythema and lips, buccal mucosa, and tongue normal  NECK: supple, thyroid normal size, non-tender, without nodularity LYMPH:  no palpable lymphadenopathy in the cervical, axillary or inguinal LUNGS: clear to auscultation and percussion with normal breathing effort HEART: regular rate & rhythm and no murmurs and no lower extremity edema ABDOMEN:abdomen soft, non-tender and normal bowel sounds Musculoskeletal:no cyanosis of digits and no clubbing  PSYCH: alert & oriented x 3 with fluent speech NEURO: no focal motor/sensory deficits  LABORATORY DATA:  I have reviewed the data as listed Recent Results (from the past 2160 hour(s))  POCT URINE PREGNANCY     Status: Normal   Collection Time    03/22/13  2:29 PM      Result Value Ref Range   Preg Test, Ur Negative     ASSESSMENT & PLAN #1 Leukocytosis #2 nicotine dependency I reassured the patient this is likely a benign cause. Her white count transiently goes down to 10.8 last year. Patient has occasional productive cough which I think could be a mild bronchitis from smoking. I recommend the patient to try is to stop smoking. In my experience, in most cases, the white blood cell  Count typically returns to normal within a month of nicotine cessation. I did discuss with her the risk and benefit of pursuing additional workup the patient is in agreement not to pursue with additional studies.

## 2013-05-25 ENCOUNTER — Telehealth: Payer: Self-pay

## 2013-05-25 NOTE — Telephone Encounter (Signed)
Called and informed of available smoking cessation (Quit Smart) classes [facility] 12 to 1:30 PM.

## 2013-06-09 ENCOUNTER — Telehealth: Payer: Self-pay | Admitting: Gynecology

## 2013-06-09 NOTE — Telephone Encounter (Signed)
Patient calling with ocp questions.  She is wondering if she misses her pills by a "few hours" every couple of days will there be any problems. Advised patient that it is important to remember to take pills each day at the same time. The risk of pregnancy is much less for women who take the pill correctly, every day at about the same time. Patient is wondering if its okay if she starts menses later in the pack with the sugar pills, advised yes, her body still needs time to be adjusted with the pills and that hopefully, with time her cycle will continue to become more regular. Patient agreeable to instructions, will follow up prn.   Routing to provider for final review. Patient agreeable to disposition. Will close encounter

## 2013-06-09 NOTE — Telephone Encounter (Signed)
Pt has questions about her birth control pills.

## 2013-06-19 ENCOUNTER — Telehealth: Payer: Self-pay | Admitting: Gynecology

## 2013-06-19 NOTE — Telephone Encounter (Signed)
Pt has questions about her birth control.

## 2013-06-19 NOTE — Telephone Encounter (Signed)
Spoke with patient. Patient states that she had to take Provera to induce cycle to start her birth control pills. Cycle started on a Tuesday after taking Provera and patient begin a new pack of pills that day. Patient would like her pack to begin on a Monday instead of a Tuesday as she is having trouble keeping up with the days. Advised patient that if her birth control pack came with stickers she could just adjust the days at the top of the pack and it would be easier to follow. Patient states that she does not have stickers and it would just be easier for the pack to start on Monday. Patient is currently on last day of active pills today and will begin sugar pills tomorrow. Advised patient to take sugar pills as she normally would and to start active pills one day early. This way she will not miss any pills and can start her pack at the beginning of the week. Advised of the importance to take pills at the same time daily and not to miss any pills. Patient agreeable and verbalizes understanding.  Dr.Lathrop, any further instruction for this patient?

## 2013-06-20 NOTE — Telephone Encounter (Signed)
No, she just takes less placebos if she wants to move cycle

## 2014-12-31 ENCOUNTER — Other Ambulatory Visit: Payer: Self-pay | Admitting: Obstetrics and Gynecology

## 2014-12-31 NOTE — Telephone Encounter (Signed)
Patient is asking for refills of birth control. Confirmed pharmacy with Patient. I tried to scheduled patient's aex. Patient declined.

## 2014-12-31 NOTE — Telephone Encounter (Signed)
No RFs are authorized.  Pt needs AEX and is overdue.  Will need to be seen for any prescriptions.  Thanks.

## 2014-12-31 NOTE — Telephone Encounter (Signed)
Medication refill request: OCP Last AEX: 01/24/13 TL Next AEX: None Last MMG (if hormonal medication request): None Refill authorized: 03/22/13 #1pack/12R. Today denied?

## 2015-01-01 NOTE — Telephone Encounter (Signed)
Called patient to schedule AEX for Rx refill.  Patient refused to schedule.  I informed her that we can not refill Rx until she is seen // kg

## 2015-01-03 ENCOUNTER — Telehealth: Payer: Self-pay | Admitting: Obstetrics and Gynecology

## 2015-01-03 NOTE — Telephone Encounter (Signed)
"  No RFs are authorized. Pt needs AEX and is overdue. Will need to be seen for any prescriptions" Dr. Hyacinth MeekerMiller 12/31/14  Called pt. Informed her she needs be seen for her refill. Offered her an earlier appt. She canceled her appt for January and states she will call back to schedule if she is able to get insurance soon.   Dr. Gloris HamMiller FYI  - encounter closed

## 2015-01-03 NOTE — Telephone Encounter (Signed)
Patient called in requesting a refill on her birth control be sent to the pharmacy on file. Please see last telephone note about this refill request. The patient is scheduled for 02/07/15 with Dr. Oscar LaJertson.

## 2015-02-07 ENCOUNTER — Ambulatory Visit: Payer: Self-pay | Admitting: Obstetrics and Gynecology

## 2015-08-01 IMAGING — NM NM HEPATO W/GB/PHARM/[PERSON_NAME]
2 series · 12 of 12 positions shown · non-contrast
Comparison: Abdominal ultrasound -09/24/2011

RADIOPHARMACEUTICALS:  5 mCi Pc-XXm Choletec

CLINICAL DATA: Periumbilical pain with nausea and vomiting

EXAM:
NUCLEAR MEDICINE HEPATOBILIARY IMAGING WITH GALLBLADDER EF
TECHNIQUE: Sequential images of the abdomen were obtained [DATE] minutes
following intravenous administration of radiopharmaceutical. After
oral ingestion of Ensure, gallbladder ejection fraction was
determined. At 60 min, normal ejection fraction is greater than 33%.

[Series 0: hepatobiliary · 3.20mm/px · 6 of 60 frames shown (1 of 2)]
[frame 6/60]
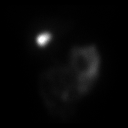
[frame 16/60]
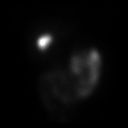
[frame 26/60]
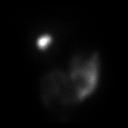
[frame 36/60]
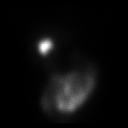
[frame 46/60]
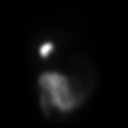
[frame 56/60]
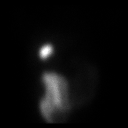

[Series 0: hepatobiliary · 3.20mm/px · 6 of 51 frames shown (2 of 2)]
[frame 5/51]
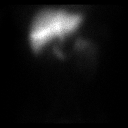
[frame 13/51]
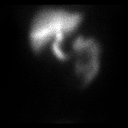
[frame 22/51]
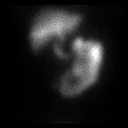
[frame 30/51]
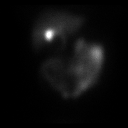
[frame 39/51]
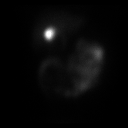
[frame 47/51]
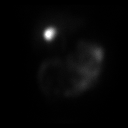

[12 of 12 positions shown; findings below may reference images not displayed]

FINDINGS: There is homogeneous distribution of injected radiotracer throughout
the hepatic parenchyma.

There is early excretion of radiotracer with opacification of the
proximal small bowel, initially seen on the provided 10 min anterior
projection planar image.

There is early opacification of the gallbladder, initially seen on
the provided 30 min anterior projection planar image. Activity
within the gallbladder is confirmed on the provided 60 min lateral
projection planar image.

There is delayed excretion of the gallbladder with decreased
gallbladder ejection fraction of 28.2%. Normal gallbladder ejection
fraction with Ensure is greater than 33%.

The patient did not experience symptoms after oral ingestion of
Ensure.
IMPRESSION: 1. No scintigraphic evidence of acute cholecystitis.
2. Indeterminate findings for biliary dyskinesia with decreased
gallbladder ejection fraction of 28.2% (normal gallbladder ejection
fraction with Ensure is greater than 33%), however the patient did
not experience abdominal pain with the ingestion of Ensure.

## 2016-06-25 DIAGNOSIS — G8929 Other chronic pain: Secondary | ICD-10-CM | POA: Insufficient documentation

## 2016-06-25 DIAGNOSIS — M545 Low back pain: Secondary | ICD-10-CM

## 2016-06-25 DIAGNOSIS — G43009 Migraine without aura, not intractable, without status migrainosus: Secondary | ICD-10-CM | POA: Insufficient documentation

## 2016-06-25 DIAGNOSIS — K219 Gastro-esophageal reflux disease without esophagitis: Secondary | ICD-10-CM | POA: Insufficient documentation

## 2016-06-25 DIAGNOSIS — E7800 Pure hypercholesterolemia, unspecified: Secondary | ICD-10-CM | POA: Insufficient documentation

## 2016-08-18 DIAGNOSIS — K5904 Chronic idiopathic constipation: Secondary | ICD-10-CM | POA: Diagnosis present

## 2016-08-18 DIAGNOSIS — E282 Polycystic ovarian syndrome: Secondary | ICD-10-CM

## 2016-08-18 HISTORY — DX: Polycystic ovarian syndrome: E28.2

## 2016-08-27 DIAGNOSIS — D729 Disorder of white blood cells, unspecified: Secondary | ICD-10-CM | POA: Insufficient documentation

## 2016-11-16 DIAGNOSIS — F411 Generalized anxiety disorder: Secondary | ICD-10-CM | POA: Diagnosis present

## 2017-12-14 DIAGNOSIS — F17200 Nicotine dependence, unspecified, uncomplicated: Secondary | ICD-10-CM | POA: Diagnosis present

## 2018-04-04 ENCOUNTER — Encounter (HOSPITAL_COMMUNITY): Payer: Self-pay

## 2018-04-04 ENCOUNTER — Other Ambulatory Visit: Payer: Self-pay

## 2018-04-04 ENCOUNTER — Emergency Department (HOSPITAL_COMMUNITY)
Admission: EM | Admit: 2018-04-04 | Discharge: 2018-04-05 | Disposition: A | Discharge status: Home / Self Care | Payer: Self-pay | Attending: Emergency Medicine | Admitting: Emergency Medicine

## 2018-04-04 DIAGNOSIS — Z79899 Other long term (current) drug therapy: Secondary | ICD-10-CM | POA: Insufficient documentation

## 2018-04-04 DIAGNOSIS — F411 Generalized anxiety disorder: Secondary | ICD-10-CM | POA: Diagnosis present

## 2018-04-04 DIAGNOSIS — T50992A Poisoning by other drugs, medicaments and biological substances, intentional self-harm, initial encounter: Secondary | ICD-10-CM

## 2018-04-04 DIAGNOSIS — R45851 Suicidal ideations: Secondary | ICD-10-CM | POA: Insufficient documentation

## 2018-04-04 DIAGNOSIS — K828 Other specified diseases of gallbladder: Secondary | ICD-10-CM | POA: Diagnosis present

## 2018-04-04 DIAGNOSIS — F332 Major depressive disorder, recurrent severe without psychotic features: Secondary | ICD-10-CM | POA: Insufficient documentation

## 2018-04-04 DIAGNOSIS — F17210 Nicotine dependence, cigarettes, uncomplicated: Secondary | ICD-10-CM | POA: Insufficient documentation

## 2018-04-04 DIAGNOSIS — F17200 Nicotine dependence, unspecified, uncomplicated: Secondary | ICD-10-CM | POA: Diagnosis present

## 2018-04-04 DIAGNOSIS — K5904 Chronic idiopathic constipation: Secondary | ICD-10-CM | POA: Diagnosis present

## 2018-04-04 DIAGNOSIS — T50902A Poisoning by unspecified drugs, medicaments and biological substances, intentional self-harm, initial encounter: Secondary | ICD-10-CM | POA: Insufficient documentation

## 2018-04-04 DIAGNOSIS — K219 Gastro-esophageal reflux disease without esophagitis: Secondary | ICD-10-CM | POA: Diagnosis present

## 2018-04-04 HISTORY — DX: Polycystic ovarian syndrome: E28.2

## 2018-04-04 HISTORY — DX: Gastric ulcer, unspecified as acute or chronic, without hemorrhage or perforation: K25.9

## 2018-04-04 LAB — COMPREHENSIVE METABOLIC PANEL
ALT: 24 U/L (ref 0–44)
AST: 25 U/L (ref 15–41)
Albumin: 4.4 g/dL (ref 3.5–5.0)
Alkaline Phosphatase: 88 U/L (ref 38–126)
Anion gap: 9 (ref 5–15)
BUN: 5 mg/dL — AB (ref 6–20)
CHLORIDE: 108 mmol/L (ref 98–111)
CO2: 22 mmol/L (ref 22–32)
CREATININE: 0.9 mg/dL (ref 0.44–1.00)
Calcium: 9 mg/dL (ref 8.9–10.3)
GFR calc Af Amer: >60 mL/min (ref >60)
GFR calc non Af Amer: >60 mL/min (ref >60)
Glucose, Bld: 88 mg/dL (ref 70–99)
Potassium: 3.8 mmol/L (ref 3.5–5.1)
Sodium: 139 mmol/L (ref 135–145)
Total Bilirubin: 0.4 mg/dL (ref 0.3–1.2)
Total Protein: 7.3 g/dL (ref 6.5–8.1)

## 2018-04-04 LAB — RAPID URINE DRUG SCREEN, HOSP PERFORMED
Amphetamines: NOT DETECTED
BENZODIAZEPINES: NOT DETECTED
Barbiturates: NOT DETECTED
Cocaine: NOT DETECTED
Opiates: NOT DETECTED
Tetrahydrocannabinol: NOT DETECTED

## 2018-04-04 LAB — CBC
HCT: 49.1 % — ABNORMAL HIGH (ref 36.0–46.0)
Hemoglobin: 16 g/dL — ABNORMAL HIGH (ref 12.0–15.0)
MCH: 29.6 pg (ref 26.0–34.0)
MCHC: 32.6 g/dL (ref 30.0–36.0)
MCV: 90.9 fL (ref 80.0–100.0)
NRBC: 0 % (ref 0.0–0.2)
Platelets: 394 10*3/uL (ref 150–400)
RBC: 5.4 MIL/uL — ABNORMAL HIGH (ref 3.87–5.11)
RDW: 12.3 % (ref 11.5–15.5)
WBC: 15.1 10*3/uL — ABNORMAL HIGH (ref 4.0–10.5)

## 2018-04-04 LAB — ETHANOL: Alcohol, Ethyl (B): <10 mg/dL (ref <10)

## 2018-04-04 LAB — ACETAMINOPHEN LEVEL: Acetaminophen (Tylenol), Serum: <10 ug/mL — ABNORMAL LOW (ref 10–30)

## 2018-04-04 LAB — SALICYLATE LEVEL: Salicylate Lvl: <7 mg/dL (ref 2.8–30.0)

## 2018-04-04 MED ORDER — SODIUM CHLORIDE 0.9 % IV BOLUS
500.0000 mL | Freq: Once | INTRAVENOUS | Status: AC
  Administered 2018-04-04: 500 mL via INTRAVENOUS

## 2018-04-04 NOTE — Progress Notes (Signed)
Ginger, Nurse informed of pt disposition. Nurse to inform Dr. Effie Shy of pt disposition.

## 2018-04-04 NOTE — ED Triage Notes (Addendum)
Pt brought to ED via [facility] after intentional overdose on Cyclobenzaprine HCL 10 mg tabs. Pt states she took "less than 10." Pt states she hates her life and wants to kill herself. Pt also with marks to her left wrist and states she made them with a butcher knife prior to taking the pills but she just wanted to hurt herself she knew the butcher knife wasn't sharp enough to kill her.
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
## 2018-04-04 NOTE — ED Provider Notes (Signed)
[facility] EMERGENCY DEPARTMENT Provider Note   CSN: [id number] Arrival date & time: 04/04/18  1933    History   Chief Complaint Chief Complaint  Patient presents with  . V70.1    HPI Donna Cisneros is a 28 y.o. female.     HPI   She presents for evaluation of intentional overdose with approximately 10 cyclobenzaprine, 10 mg tablets.  She states that she took these between 5 and 6 PM tonight.  Her intent was to kill herself.  She is distraught about ongoing health problems, financial difficulty, and not getting along with her husband.  Ports arguing with her husband frequently.  They also sometimes yell at each other.  She does not currently receive therapy or getting medication.  She has a history of hospitalizations for suicide attempts, as a child.  She states that she has chronic abdominal problems including upper abdominal pain and constipation.  She has been evaluated, by GI with endoscopy and put on Carafate, to treat esophageal inflammation.  She denies recent illnesses including fever, vomiting, chest pain, cough, weakness or dizziness.  There are no other known modifying factors.  Past Medical History:  Diagnosis Date  . Acid reflux   . Amenorrhea 01/24/2013  . Anxiety   . Irregular menstrual cycle 01/24/2013  . PCOS (polycystic ovarian syndrome)   . Stomach ulcer     Patient Active Problem List   Diagnosis Date Noted  . Biliary dyskinesia 02/27/2013  . Morbid obesity (HCC) 01/24/2013  . Irregular menstrual cycle 01/24/2013  . Amenorrhea 01/24/2013  . Acid reflux   . Migraines 04/01/2011    Past Surgical History:  Procedure Laterality Date  . CHOLECYSTECTOMY       OB History    Gravida  0   Para  0   Term  0   Preterm  0   AB  0   Living  0     SAB  0   TAB  0   Ectopic  0   Multiple  0   Live Births               Home Medications    Prior to Admission medications   Medication Sig Start Date End Date Taking? Authorizing  Provider  acetaminophen (MIDOL) 650 MG CR tablet Take 650 mg by mouth every 8 (eight) hours as needed for pain.   Yes [provider]  clove oil liquid Apply 1 application topically daily as needed (flatulence/digestion).   Yes [provider]  cyclobenzaprine (FLEXERIL) 10 MG tablet Take 10 mg by mouth 3 (three) times daily as needed for muscle spasms.   Yes [provider]  gabapentin (NEURONTIN) 100 MG capsule Take 100 mg by mouth 3 (three) times daily as needed. 07/06/16  Yes [provider]  levonorgestrel-ethinyl estradiol (NORDETTE) 0.15-30 MG-MCG tablet Take 1 tablet by mouth daily. 03/22/13  Yes Douglass Rivers, MD  promethazine (PHENERGAN) 25 MG tablet Take 25 mg by mouth every 6 (six) hours as needed for nausea or vomiting.  11/10/17  Yes [provider]  sucralfate (CARAFATE) 1 g tablet Take 1 tablet by mouth 4 (four) times daily. To be taken 2 hours before before/after taking other medications 11/16/16  Yes [provider]    Family History Family History  Problem Relation Age of Onset  . Heart disease Maternal Grandfather     Social History Social History   Tobacco Use  . Smoking status: Current Every Day Smoker  Packs/day: 0.50    Years: 11.00    Pack years: 5.50    Types: Cigarettes  . Smokeless tobacco: Never Used  Substance Use Topics  . Alcohol use: Yes    Comment: occassionally  . Drug use: No     Allergies   Patient has no known allergies.   Review of Systems Review of Systems  All other systems reviewed and are negative.    Physical Exam Updated Vital Signs BP (!) 110/54   Pulse 98   Resp 20   Ht 5' (1.524 m)   Wt 73.9 kg   LMP 03/30/2018   SpO2 99%   BMI 31.83 kg/m   Physical Exam Vitals signs and nursing note reviewed.  Constitutional:      General: She is not in acute distress.    Appearance: Normal appearance. She is well-developed. She is not ill-appearing, toxic-appearing or  diaphoretic.  HENT:     Head: Normocephalic and atraumatic.     Right Ear: External ear normal.     Left Ear: External ear normal.     Nose: No congestion.     Mouth/Throat:     Mouth: Mucous membranes are moist.     Pharynx: No oropharyngeal exudate.  Eyes:     Conjunctiva/sclera: Conjunctivae normal.     Pupils: Pupils are equal, round, and reactive to light.  Neck:     Musculoskeletal: Normal range of motion and neck supple.     Trachea: Phonation normal.  Cardiovascular:     Rate and Rhythm: Normal rate and regular rhythm.     Heart sounds: Normal heart sounds.  Pulmonary:     Effort: Pulmonary effort is normal.     Breath sounds: Normal breath sounds.  Abdominal:     Palpations: Abdomen is soft.     Tenderness: There is no abdominal tenderness.  Musculoskeletal: Normal range of motion.  Skin:    General: Skin is warm and dry.  Neurological:     Mental Status: She is alert and oriented to person, place, and time.     Cranial Nerves: No cranial nerve deficit.     Sensory: No sensory deficit.     Motor: No abnormal muscle tone.     Coordination: Coordination normal.  Psychiatric:        Attention and Perception: Attention normal. She does not perceive auditory or visual hallucinations.        Mood and Affect: Affect is labile.        Speech: Speech normal.        Behavior: Behavior normal. Behavior is cooperative.        Thought Content: Thought content includes suicidal ideation. Thought content includes suicidal plan.        Cognition and Memory: Cognition normal.        Judgment: Judgment is impulsive and inappropriate.      ED Treatments / Results  Labs (all labs ordered are listed, but only abnormal results are displayed) Labs Reviewed  COMPREHENSIVE METABOLIC PANEL - Abnormal; Notable for the following components:      Result Value   BUN 5 (*)    All other components within normal limits  ACETAMINOPHEN LEVEL - Abnormal; Notable for the following  components:   Acetaminophen (Tylenol), Serum <10 (*)    All other components within normal limits  CBC - Abnormal; Notable for the following components:   WBC 15.1 (*)    RBC 5.40 (*)    Hemoglobin 16.0 (*)  HCT 49.1 (*)    All other components within normal limits  ETHANOL  SALICYLATE LEVEL  RAPID URINE DRUG SCREEN, HOSP PERFORMED    EKG EKG Interpretation  Date/Time:  Monday April 04 2018 19:56:54 EST Ventricular Rate:  128 PR Interval:    QRS Duration: 66 QT Interval:  299 QTC Calculation: 437 R Axis:   52 Text Interpretation:  Sinus tachycardia Ventricular premature complex No old tracing to compare Confirmed by Mancel Bale [phone]) on 04/04/2018 8:16:18 PM   Radiology No results found.  Procedures Procedures (including critical care time)  Medications Ordered in ED Medications  sodium chloride 0.9 % bolus 500 mL (500 mLs Intravenous New Bag/Given 04/04/18 2150)     Initial Impression / Assessment and Plan / ED Course  I have reviewed the triage vital signs and the nursing notes.  Pertinent labs & imaging results that were available during my care of the patient were reviewed by me and considered in my medical decision making (see chart for details).  Clinical Course as of Apr 03 2205  Mon Apr 04, 2018  2131 Normal  Acetaminophen level(!) [EW]  2131 Normal  Ethanol [EW]  2131 Normal except elevated white count and hemoglobin.  cbc(!) [EW]  2132 Normal  Rapid urine drug screen (hospital performed) [EW]  2132 Normal  Comprehensive metabolic panel(!) [EW]  2132 Normal  Salicylate level [EW]  2132 At this point the patient is medically cleared for treatment by psychiatry.  TTS consultation ordered   [EW]    Clinical Course User Index [EW] Mancel Bale, MD        Patient Vitals for the past 24 hrs:  BP Pulse Resp SpO2 Height Weight  04/04/18 2130 (!) 110/54 98 20 99 % - -  04/04/18 2115 - (!) 110 20 100 % - -  04/04/18 2100 124/72 (!) 113 19 99  % - -  04/04/18 2034 124/82 (!) 130 (!) 22 100 % - -  04/04/18 2000 - (!) 126 10 100 % - -  04/04/18 1957 - (!) 115 20 100 % - -  04/04/18 1948 - (!) 120 - 100 % - -  04/04/18 1945 - (!) 120 - 100 % - -  04/04/18 1943 - - - - 5' (1.524 m) 73.9 kg  04/04/18 1939 129/88 - - - - -  04/04/18 1938 129/88 (!) 132 18 99 % - -    9:32 PM Reevaluation with update and discussion. After initial assessment and treatment, an updated evaluation reveals no change in clinical status, findings discussed and questions answered. Mancel Bale   Medical Decision Making: Suicidal ideation with intentional drug overdose to kill self.  Patient medically cleared and stable for evaluation and treatment by the psychiatry service.  CRITICAL CARE-no Performed by: Mancel Bale  Nursing Notes Reviewed/ Care Coordinated Applicable Imaging Reviewed Interpretation of Laboratory Data incorporated into ED treatment   Plan-as per TTS in conjunction with oncoming provider team  Final Clinical Impressions(s) / ED Diagnoses   Final diagnoses:  Suicidal ideation  Intentional drug overdose, initial encounter [facility])    ED Discharge Orders    None       Mancel Bale, MD 04/04/18 2207

## 2018-04-05 ENCOUNTER — Inpatient Hospital Stay
Admission: AD | Admit: 2018-04-05 | Discharge: 2018-04-08 | DRG: 885 | Disposition: A | Discharge status: Home / Self Care | Payer: No Typology Code available for payment source | Source: Intra-hospital | Attending: Psychiatry | Admitting: Psychiatry

## 2018-04-05 ENCOUNTER — Encounter (HOSPITAL_COMMUNITY): Payer: Self-pay | Admitting: Psychiatry

## 2018-04-05 ENCOUNTER — Encounter: Payer: Self-pay | Admitting: *Deleted

## 2018-04-05 DIAGNOSIS — Z9049 Acquired absence of other specified parts of digestive tract: Secondary | ICD-10-CM

## 2018-04-05 DIAGNOSIS — F4310 Post-traumatic stress disorder, unspecified: Secondary | ICD-10-CM | POA: Diagnosis present

## 2018-04-05 DIAGNOSIS — Z8249 Family history of ischemic heart disease and other diseases of the circulatory system: Secondary | ICD-10-CM | POA: Diagnosis not present

## 2018-04-05 DIAGNOSIS — Z915 Personal history of self-harm: Secondary | ICD-10-CM | POA: Diagnosis not present

## 2018-04-05 DIAGNOSIS — K219 Gastro-esophageal reflux disease without esophagitis: Secondary | ICD-10-CM | POA: Diagnosis present

## 2018-04-05 DIAGNOSIS — T50902A Poisoning by unspecified drugs, medicaments and biological substances, intentional self-harm, initial encounter: Secondary | ICD-10-CM | POA: Diagnosis present

## 2018-04-05 DIAGNOSIS — Z818 Family history of other mental and behavioral disorders: Secondary | ICD-10-CM | POA: Diagnosis not present

## 2018-04-05 DIAGNOSIS — F429 Obsessive-compulsive disorder, unspecified: Secondary | ICD-10-CM | POA: Diagnosis present

## 2018-04-05 DIAGNOSIS — Z8711 Personal history of peptic ulcer disease: Secondary | ICD-10-CM

## 2018-04-05 DIAGNOSIS — E282 Polycystic ovarian syndrome: Secondary | ICD-10-CM | POA: Diagnosis present

## 2018-04-05 DIAGNOSIS — F332 Major depressive disorder, recurrent severe without psychotic features: Secondary | ICD-10-CM | POA: Diagnosis present

## 2018-04-05 DIAGNOSIS — F17200 Nicotine dependence, unspecified, uncomplicated: Secondary | ICD-10-CM | POA: Diagnosis present

## 2018-04-05 DIAGNOSIS — G8929 Other chronic pain: Secondary | ICD-10-CM | POA: Diagnosis present

## 2018-04-05 DIAGNOSIS — F410 Panic disorder [episodic paroxysmal anxiety] without agoraphobia: Secondary | ICD-10-CM | POA: Diagnosis present

## 2018-04-05 DIAGNOSIS — Z79899 Other long term (current) drug therapy: Secondary | ICD-10-CM | POA: Diagnosis not present

## 2018-04-05 DIAGNOSIS — F17210 Nicotine dependence, cigarettes, uncomplicated: Secondary | ICD-10-CM | POA: Diagnosis present

## 2018-04-05 DIAGNOSIS — F419 Anxiety disorder, unspecified: Secondary | ICD-10-CM | POA: Diagnosis present

## 2018-04-05 DIAGNOSIS — K589 Irritable bowel syndrome without diarrhea: Secondary | ICD-10-CM | POA: Diagnosis present

## 2018-04-05 DIAGNOSIS — K5909 Other constipation: Secondary | ICD-10-CM | POA: Diagnosis present

## 2018-04-05 DIAGNOSIS — K581 Irritable bowel syndrome with constipation: Secondary | ICD-10-CM | POA: Diagnosis present

## 2018-04-05 DIAGNOSIS — T1491XA Suicide attempt, initial encounter: Secondary | ICD-10-CM | POA: Insufficient documentation

## 2018-04-05 DIAGNOSIS — T50992A Poisoning by other drugs, medicaments and biological substances, intentional self-harm, initial encounter: Secondary | ICD-10-CM

## 2018-04-05 DIAGNOSIS — F4010 Social phobia, unspecified: Secondary | ICD-10-CM | POA: Diagnosis present

## 2018-04-05 HISTORY — DX: Major depressive disorder, recurrent severe without psychotic features: F33.2

## 2018-04-05 LAB — HCG, SERUM, QUALITATIVE: Preg, Serum: NEGATIVE

## 2018-04-05 MED ORDER — LEVONORGESTREL-ETHINYL ESTRAD 0.15-30 MG-MCG PO TABS: 1.0000 | ORAL_TABLET | Freq: Every day | ORAL | Status: DC

## 2018-04-05 MED ORDER — SUCRALFATE 1 G PO TABS
1.0000 g | ORAL_TABLET | Freq: Four times a day (QID) | ORAL | Status: DC
  Administered 2018-04-05 – 2018-04-06 (×4): 1 g via ORAL
  Filled 2018-04-05 (×4): qty 1

## 2018-04-05 MED ORDER — HYDROXYZINE HCL 25 MG PO TABS
25.0000 mg | ORAL_TABLET | Freq: Four times a day (QID) | ORAL | Status: DC | PRN
  Administered 2018-04-06: 25 mg via ORAL
  Filled 2018-04-05: qty 1

## 2018-04-05 MED ORDER — DIPHENHYDRAMINE HCL 25 MG PO CAPS: 25.0000 mg | ORAL_CAPSULE | Freq: Every evening | ORAL | Status: DC | PRN

## 2018-04-05 MED ORDER — MAGNESIUM HYDROXIDE 400 MG/5ML PO SUSP: 30.0000 mL | Freq: Every day | ORAL | Status: DC | PRN

## 2018-04-05 MED ORDER — ACETAMINOPHEN 325 MG PO TABS: 650.0000 mg | ORAL_TABLET | Freq: Four times a day (QID) | ORAL | Status: DC | PRN

## 2018-04-05 MED ORDER — LEVONORGESTREL-ETHINYL ESTRAD 0.15-30 MG-MCG PO TABS
1.0000 | ORAL_TABLET | Freq: Every day | ORAL | Status: DC
  Administered 2018-04-06 – 2018-04-07 (×2): 1 via ORAL

## 2018-04-05 MED ORDER — ALUM & MAG HYDROXIDE-SIMETH 200-200-20 MG/5ML PO SUSP
30.0000 mL | ORAL | Status: DC | PRN
  Administered 2018-04-07: 30 mL via ORAL
  Filled 2018-04-05: qty 30

## 2018-04-05 NOTE — Progress Notes (Signed)
D: Patient has irritable mood.  She has been sleeping this afternoon.  She did have a visit with her husband.  Patient has poor hygiene and has been given toiletries for a shower.  Her appetite is poor.  Patient asked if she could have her "milk tea" brought in from outside.  Explained to patient that outside food/drinks can be brought in.  A: Continue to monitor medication management and MD orders.  Safety checks completed every 15 minutes per protocol.  Offer support and encouragement as needed.  R: Patient is receptive to staff; her behavior is appropriate.

## 2018-04-05 NOTE — BH Assessment (Signed)
Patient has been accepted to [facility].  Accepting physician is Dr. Viviano Simas.  Attending Physician will be Dr. Jennet Maduro.  Patient has been assigned to room 301, by [facility] Charge Nurse [place].  Call report to [phone].  Representative/Transfer Coordinator is Warden/ranger Patient pre-admitted by [facility] Patient Access Vikki Ports)  [facility] Forest Becker, Social Worker Disposition) made aware of acceptance.

## 2018-04-05 NOTE — BHH Group Notes (Signed)
Feelings Around Diagnosis 04/05/2018 9:30AM/1PM  Type of Therapy/Topic:  Group Therapy:  Feelings about Diagnosis  Participation Level:  Did Not Attend   Description of Group:   This group will allow patients to explore their thoughts and feelings about diagnoses they have received. Patients will be guided to explore their level of understanding and acceptance of these diagnoses. Facilitator will encourage patients to process their thoughts and feelings about the reactions of others to their diagnosis and will guide patients in identifying ways to discuss their diagnosis with significant others in their lives. This group will be process-oriented, with patients participating in exploration of their own experiences, giving and receiving support, and processing challenge from other group members.   Therapeutic Goals: 1. Patient will demonstrate understanding of diagnosis as evidenced by identifying two or more symptoms of the disorder 2. Patient will be able to express two feelings regarding the diagnosis 3. Patient will demonstrate their ability to communicate their needs through discussion and/or role play  Summary of Patient Progress:       Therapeutic Modalities:   Cognitive Behavioral Therapy Brief Therapy Feelings Identification    Suzan Slick, LCSW 04/05/2018 2:21 PM

## 2018-04-05 NOTE — ED Notes (Signed)
Called [facility] for transport to [facility].

## 2018-04-05 NOTE — ED Notes (Signed)
Pt given meal tray.

## 2018-04-05 NOTE — Progress Notes (Addendum)
Pt accepted to [facility], Bed 301 Donna Cool, MD, is the accepting provider.  JolantaPucilowska, MD is the attending provider.  Call report to [phone] [facility]   ED notified.   Pt is Voluntary.  Pt may be transported by [facility]  Pt scheduled  to arrive at [facility]  at TIME TBD.  Fax Voluntary Consent for Treatment to [phone]  Donna Cisneros. Kaylyn Lim, MSW, LCSWA Disposition Clinical Social Work [phone] (cell) [phone] (office)
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
## 2018-04-06 DIAGNOSIS — F429 Obsessive-compulsive disorder, unspecified: Secondary | ICD-10-CM

## 2018-04-06 DIAGNOSIS — F332 Major depressive disorder, recurrent severe without psychotic features: Principal | ICD-10-CM

## 2018-04-06 DIAGNOSIS — F4310 Post-traumatic stress disorder, unspecified: Secondary | ICD-10-CM

## 2018-04-06 MED ORDER — CALCIUM POLYCARBOPHIL 625 MG PO TABS
1250.0000 mg | ORAL_TABLET | Freq: Every day | ORAL | Status: DC
  Administered 2018-04-06 – 2018-04-08 (×3): 1250 mg via ORAL
  Filled 2018-04-06 (×4): qty 2

## 2018-04-06 NOTE — Plan of Care (Signed)
Patient is alert and oriented, blunted affect, disheveled and denies SI, HI AVH. Complains this morning of anxiety 3/10 and very poor sleep last night. Appetite is fair and energy level patient rates as normal. Nurse will encourage participation in group therapy sessions. Patient believes she is supposed to start Wellbutrin but unsure when and express maybe wanting to wait. No self harming actions observed at this time. Problem: Education: Goal: Utilization of techniques to improve thought processes will improve Outcome: Progressing Goal: Knowledge of the prescribed therapeutic regimen will improve Outcome: Progressing   Problem: Activity: Goal: Interest or engagement in leisure activities will improve Outcome: Progressing Goal: Imbalance in normal sleep/wake cycle will improve Outcome: Progressing   Problem: Coping: Goal: Coping ability will improve Outcome: Progressing Goal: Will verbalize feelings Outcome: Progressing

## 2018-04-06 NOTE — [facility] Suicide Risk Assessment (Signed)
[facility] INPATIENT:  Family/Significant Other Suicide Prevention Education  Suicide Prevention Education:  Patient Refusal for Family/Significant Other Suicide Prevention Education: The patient Donna Cisneros has refused to provide written consent for family/significant other to be provided Family/Significant Other Suicide Prevention Education during admission and/or prior to discharge.  Physician notified.  SPE completed with pt, as pt refused to consent to family contact. SPI pamphlet provided to pt and pt was encouraged to share information with support network, ask questions, and talk about any concerns relating to SPE. Pt denies access to guns/firearms and verbalized understanding of information provided. Mobile Crisis information also provided to pt.   Charlann Lange Krishna Heuer MSW LCSW 04/06/2018, 9:44 AM
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
## 2018-04-06 NOTE — [facility] Group Notes (Signed)
[facility] Group Notes:  (Nursing/MHT/Case Management/Adjunct)  Date:  04/06/2018  Time:  9:07 PM  Type of Therapy:  Group Therapy  Participation Level:  Active  Participation Quality:  Appropriate  Affect:  Appropriate  Cognitive:  Appropriate  Insight:  Appropriate  Engagement in Group:  Engaged  Modes of Intervention:  Discussion  Summary of Progress/Problems:  Donna Cisneros 04/06/2018, 9:07 PM

## 2018-04-06 NOTE — [facility] Group Notes (Signed)
LCSW Group Therapy Note  04/06/2018 12:33 PM  Type of Therapy/Topic:  Group Therapy:  Emotion Regulation  Participation Level:  Did Not Attend   Description of Group:   The purpose of this group is to assist patients in learning to regulate negative emotions and experience positive emotions. Patients will be guided to discuss ways in which they have been vulnerable to their negative emotions. These vulnerabilities will be juxtaposed with experiences of positive emotions or situations, and patients will be challenged to use positive emotions to combat negative ones. Special emphasis will be placed on coping with negative emotions in conflict situations, and patients will process healthy conflict resolution skills.  Therapeutic Goals: 1. Patient will identify two positive emotions or experiences to reflect on in order to balance out negative emotions 2. Patient will label two or more emotions that they find the most difficult to experience 3. Patient will demonstrate positive conflict resolution skills through discussion and/or role plays  Summary of Patient Progress: x   Therapeutic Modalities:   Cognitive Behavioral Therapy Feelings Identification Dialectical Behavioral Therapy   Iris Pert, MSW, LCSW Clinical Social Work 04/06/2018 12:33 PM

## 2018-04-06 NOTE — Progress Notes (Signed)
Recreation Therapy Notes  Date: 04/06/2018  Time: 9:30 am   Location: Craft room   Behavioral response: N/A   Intervention Topic: Coping Skills  Discussion/Intervention: Patient did not attend group.   Clinical Observations/Feedback:  Patient did not attend group.   Olson Lucarelli LRT/CTRS         Rowen Wilmer 04/06/2018 11:36 AM 

## 2018-04-06 NOTE — Consult Note (Signed)
[facility] GI Inpatient Consult Note   Jamey Reas, M.D.  Reason for Consult: Chronic abdominal pain, change in bowel habits, "possible Crohn's"   Attending Requesting Consult: Dr. Jennet Maduro    History of Present Illness: Donna Cisneros is a 28 y.o. female admitted 2 days ago for attempted suicide with overdose of Flexeril tablets.  Patient was admitted and monitored medically initially in the emergency room, and then was transferred to inpatient psychiatry.  The GI service has been consulted for patient's complaint of chronic abdominal pain and "possible Crohn's disease". I saw the patient today in room 301 in the behavioral health unit accompanied by a medical assistant.  Patient says she has chronic abdominal pain and "bloating" with chronic recurrent symptoms of constipation and some increased straining.  Patient also admits to diffuse abdominal bloating along with diffuse abdominal pain that is somewhat relieved with having a bowel movement or passing flatus.  There is been no significant rectal bleeding loss, fever or diarrhea.  There is no known family history of colon cancer, inflammatory bowel disease or liver disease.  The patient had a remote history of cholecystectomy secondary to persistent abdominal pain and equivocally abnormal HIDA scan.  Patient said initially her pain improved after cholecystectomy but then recurred several months later.  The pain appears to be diffuse "achy" pain that does not radiate and is related with change in bowel habits.  There is symptoms of a sensation of incomplete defecation along with altered stool caliber.  There is chronic nausea with intermittent vomiting symptoms as well.  Patient does not have any significant acid reflux disease. Patient is wanting a colonoscopy at some point "because the doctor told me I might have Crohn's disease as a cause of my pain".  She has seen Dr.Jyothi Loreta Ave, gastroenterologist, [facility] in  [place] and underwent an upper endoscopy circa 2014 showing gastritis, duodenitis and esophagitis.  Follow-up was not made apparently to see the patient back.  Patient has never had a colonoscopy.  Past Medical History:  Past Medical History:  Diagnosis Date  . Acid reflux   . Amenorrhea 01/24/2013  . Anxiety   . Irregular menstrual cycle 01/24/2013  . Major depressive disorder, recurrent episode, severe (HCC) 04/05/2018  . PCOS (polycystic ovarian syndrome)   . PCOS (polycystic ovarian syndrome) 08/18/2016  . Stomach ulcer     Problem List: Patient Active Problem List   Diagnosis Date Noted  . OCD (obsessive compulsive disorder) 04/06/2018  . PTSD (post-traumatic stress disorder) 04/06/2018  . Suicide attempt by drug overdose (HCC) 04/05/2018  . Suicide attempt (HCC) 04/05/2018  . Major depressive disorder, recurrent episode, severe (HCC) 04/05/2018  . Tobacco use disorder 12/14/2017  . Generalized anxiety disorder 11/16/2016  . Neutrophilic leukocytosis 08/27/2016  . Chronic idiopathic constipation 08/18/2016  . PCOS (polycystic ovarian syndrome) 08/18/2016  . GERD (gastroesophageal reflux disease) 06/25/2016  . Chronic bilateral low back pain 06/25/2016  . Pure hypercholesterolemia 06/25/2016  . Common migraine 06/25/2016  . Biliary dyskinesia 02/27/2013  . Morbid obesity (HCC) 01/24/2013  . Irregular menstrual cycle 01/24/2013  . Amenorrhea 01/24/2013  . Migraines 04/01/2011    Past Surgical History: Past Surgical History:  Procedure Laterality Date  . CHOLECYSTECTOMY      Allergies: No Known Allergies  Home Medications: Medications Prior to Admission  Medication Sig Dispense Refill Last Dose  . acetaminophen (MIDOL) 650 MG CR tablet Take 650 mg by mouth every 8 (eight) hours as needed for pain.   04/04/2018  at Unknown time  . clove oil liquid Apply 1 application topically daily as needed (flatulence/digestion).   Past Week at Unknown time  . cyclobenzaprine  (FLEXERIL) 10 MG tablet Take 10 mg by mouth 3 (three) times daily as needed for muscle spasms.   04/04/2018 at Unknown time  . gabapentin (NEURONTIN) 100 MG capsule Take 100 mg by mouth 3 (three) times daily as needed.   Past Week at Unknown time  . levonorgestrel-ethinyl estradiol (NORDETTE) 0.15-30 MG-MCG tablet Take 1 tablet by mouth daily. 1 Package 12 04/04/2018 at Unknown time  . promethazine (PHENERGAN) 25 MG tablet Take 25 mg by mouth every 6 (six) hours as needed for nausea or vomiting.    Past Month at Unknown time  . sucralfate (CARAFATE) 1 g tablet Take 1 tablet by mouth 4 (four) times daily. To be taken 2 hours before before/after taking other medications   Past Week at Unknown time   Home medication reconciliation was completed with the patient.   Scheduled Inpatient Medications:   . levonorgestrel-ethinyl estradiol  1 tablet Oral Daily  . sucralfate  1 g Oral QID    Continuous Inpatient Infusions:    PRN Inpatient Medications:  acetaminophen, alum & mag hydroxide-simeth, diphenhydrAMINE, hydrOXYzine, magnesium hydroxide  Family History: family history includes Heart disease in her maternal grandfather.   GI Family History: Negative except for several members of her family who "had to have gall bladders removed"  Social History:   reports that she has been smoking cigarettes. She has a 5.50 pack-year smoking history. She has never used smokeless tobacco. She reports current alcohol use. She reports that she does not use drugs.   Review of Systems: Review of Systems - Negative except Suicidal ideation ,symptoms of depression and GI symptoms in HPI.  Physical Examination: BP 101/60 (BP Location: Left Arm)   Pulse 89   Temp 98.2 F (36.8 C) (Oral)   Resp 18   Ht 5\' 1"  (1.549 m)   Wt 73.9 kg   LMP 03/30/2018   SpO2 99%   BMI 30.80 kg/m  Physical Exam Vitals signs reviewed. Exam conducted with a chaperone present.  Constitutional:      General: She is not in acute  distress.    Appearance: Normal appearance. She is obese. She is not toxic-appearing or diaphoretic.  HENT:     Head: Normocephalic and atraumatic.     Nose: Nose normal.  Eyes:     Extraocular Movements: Extraocular movements intact.     Pupils: Pupils are equal, round, and reactive to light.  Neck:     Musculoskeletal: Normal range of motion.  Cardiovascular:     Rate and Rhythm: Normal rate and regular rhythm.     Pulses: Normal pulses.  Pulmonary:     Effort: Pulmonary effort is normal.     Breath sounds: Normal breath sounds.  Abdominal:     Palpations: Abdomen is soft.     Tenderness: There is abdominal tenderness. There is no rebound.  Skin:    General: Skin is warm and dry.  Neurological:     General: No focal deficit present.     Mental Status: She is alert.  Psychiatric:        Mood and Affect: Mood normal.     Data: Lab Results  Component Value Date   WBC 15.1 (H) 04/04/2018   HGB 16.0 (H) 04/04/2018   HCT 49.1 (H) 04/04/2018   MCV 90.9 04/04/2018   PLT 394 04/04/2018  Recent Labs  Lab 04/04/18 2003  HGB 16.0*   Lab Results  Component Value Date   NA 139 04/04/2018   K 3.8 04/04/2018   CL 108 04/04/2018   CO2 22 04/04/2018   BUN 5 (L) 04/04/2018   CREATININE 0.90 04/04/2018   Lab Results  Component Value Date   ALT 24 04/04/2018   AST 25 04/04/2018   ALKPHOS 88 04/04/2018   BILITOT 0.4 04/04/2018   No results for input(s): APTT, INR, PTT in the last 168 hours. CBC Latest Ref Rng & Units 04/04/2018 09/24/2011  WBC 4.0 - 10.5 K/uL 15.1(H) 13.5(H)  Hemoglobin 12.0 - 15.0 g/dL 16.0(H) 14.4  Hematocrit 36.0 - 46.0 % 49.1(H) 42.5  Platelets 150 - 400 K/uL 394 396    STUDIES: No results found. @  Assessment:  1. Chronic abdominal pain - Patient appears to suffice most criteria for constipation predominant IBS. DDx also includes malignancy, ibd, colonic inertia, functional bowel disorder.  2. Nausea.  3. Chronic constipation - As  above  Recommendations: 1. Will try fiber supplements. Advise benefiber if available as plant based fiber will cause more bloating.  2. Colace  daily PRN.  3. Outpatient colonoscopy can be considered at a later time, but this is not necessary as inpatient. I realize patient has no insurance, but this is not considered medically necessary at this time.  4. Call me back if I can help.   Thank you for the consult. Please call with questions or concerns.  Rosina Lowenstein, "Mellody Dance MD [facility] [address] [place], [place] [address] [phone]  04/06/2018 2:33 PM

## 2018-04-06 NOTE — Tx Team (Addendum)
Interdisciplinary Treatment and Diagnostic Plan Update  04/06/2018 Time of Session: 1030AM Donna Cisneros MRN: [id number]  Principal Diagnosis: Major depressive disorder, recurrent episode, severe (HCC)  Secondary Diagnoses: Principal Problem:   Major depressive disorder, recurrent episode, severe (HCC) Active Problems:   Suicide attempt by drug overdose (HCC)   Current Medications:  Current Facility-Administered Medications  Medication Dose Route Frequency Provider Last Rate Last Dose  . acetaminophen (TYLENOL) tablet 650 mg  650 mg Oral Q6H PRN Mariel Craft, MD      . alum & mag hydroxide-simeth (MAALOX/MYLANTA) 200-200-20 MG/5ML suspension 30 mL  30 mL Oral Q4H PRN Mariel Craft, MD      . diphenhydrAMINE (BENADRYL) capsule 25 mg  25 mg Oral QHS PRN Mariel Craft, MD      . hydrOXYzine (ATARAX/VISTARIL) tablet 25 mg  25 mg Oral Q6H PRN Mariel Craft, MD      . levonorgestrel-ethinyl estradiol (NORDETTE) 0.15-30 MG-MCG per tablet 1 tablet  1 tablet Oral Daily Pucilowska, Jolanta B, MD   1 tablet at 04/06/18 0807  . magnesium hydroxide (MILK OF MAGNESIA) suspension 30 mL  30 mL Oral Daily PRN Mariel Craft, MD      . sucralfate (CARAFATE) tablet 1 g  1 g Oral QID Mariel Craft, MD   1 g at 04/06/18 1038   PTA Medications: Medications Prior to Admission  Medication Sig Dispense Refill Last Dose  . acetaminophen (MIDOL) 650 MG CR tablet Take 650 mg by mouth every 8 (eight) hours as needed for pain.   04/04/2018 at Unknown time  . clove oil liquid Apply 1 application topically daily as needed (flatulence/digestion).   Past Week at Unknown time  . cyclobenzaprine (FLEXERIL) 10 MG tablet Take 10 mg by mouth 3 (three) times daily as needed for muscle spasms.   04/04/2018 at Unknown time  . gabapentin (NEURONTIN) 100 MG capsule Take 100 mg by mouth 3 (three) times daily as needed.   Past Week at Unknown time  . levonorgestrel-ethinyl estradiol (NORDETTE) 0.15-30 MG-MCG  tablet Take 1 tablet by mouth daily. 1 Package 12 04/04/2018 at Unknown time  . promethazine (PHENERGAN) 25 MG tablet Take 25 mg by mouth every 6 (six) hours as needed for nausea or vomiting.    Past Month at Unknown time  . sucralfate (CARAFATE) 1 g tablet Take 1 tablet by mouth 4 (four) times daily. To be taken 2 hours before before/after taking other medications   Past Week at Unknown time    Patient Stressors:    Patient Strengths:    Treatment Modalities: Medication Management, Group therapy, Case management,  1 to 1 session with clinician, Psychoeducation, Recreational therapy.   Physician Treatment Plan for Primary Diagnosis: Major depressive disorder, recurrent episode, severe (HCC) Long Term Goal(s):     Short Term Goals:    Medication Management: Evaluate patient's response, side effects, and tolerance of medication regimen.  Therapeutic Interventions: 1 to 1 sessions, Unit Group sessions and Medication administration.  Evaluation of Outcomes: Progressing  Physician Treatment Plan for Secondary Diagnosis: Principal Problem:   Major depressive disorder, recurrent episode, severe (HCC) Active Problems:   Suicide attempt by drug overdose (HCC)  Long Term Goal(s):     Short Term Goals:       Medication Management: Evaluate patient's response, side effects, and tolerance of medication regimen.  Therapeutic Interventions: 1 to 1 sessions, Unit Group sessions and Medication administration.  Evaluation of Outcomes: Progressing   RN Treatment Plan  for Primary Diagnosis: Major depressive disorder, recurrent episode, severe (HCC) Long Term Goal(s): Knowledge of disease and therapeutic regimen to maintain health will improve  Short Term Goals: Ability to verbalize frustration and anger appropriately will improve, Ability to demonstrate self-control, Ability to participate in decision making will improve and Ability to identify and develop effective coping behaviors will  improve  Medication Management: RN will administer medications as ordered by provider, will assess and evaluate patient's response and provide education to patient for prescribed medication. RN will report any adverse and/or side effects to prescribing provider.  Therapeutic Interventions: 1 on 1 counseling sessions, Psychoeducation, Medication administration, Evaluate responses to treatment, Monitor vital signs and CBGs as ordered, Perform/monitor CIWA, COWS, AIMS and Fall Risk screenings as ordered, Perform wound care treatments as ordered.  Evaluation of Outcomes: Progressing   LCSW Treatment Plan for Primary Diagnosis: Major depressive disorder, recurrent episode, severe (HCC) Long Term Goal(s): Safe transition to appropriate next level of care at discharge, Engage patient in therapeutic group addressing interpersonal concerns.  Short Term Goals: Engage patient in aftercare planning with referrals and resources, Increase emotional regulation, Facilitate acceptance of mental health diagnosis and concerns and Increase skills for wellness and recovery  Therapeutic Interventions: Assess for all discharge needs, 1 to 1 time with Social worker, Explore available resources and support systems, Assess for adequacy in community support network, Educate family and significant other(s) on suicide prevention, Complete Psychosocial Assessment, Interpersonal group therapy.  Evaluation of Outcomes: Progressing   Progress in Treatment: Attending groups: No. Participating in groups: No. Taking medication as prescribed: Yes. Toleration medication: Yes. Family/Significant other contact made: No, will contact:  SPE completed with pt as pt declined collateral contact Patient understands diagnosis: Yes. Discussing patient identified problems/goals with staff: Yes. Medical problems stabilized or resolved: No. Pt is seeing GI doctor today Denies suicidal/homicidal ideation: Yes. Issues/concerns per patient  self-inventory: No. Other: n/a  New problem(s) identified: No, Describe:  none  New Short Term/Long Term Goal(s):medication management for mood stabilization; development of comprehensive mental wellness/sobriety plan.   Patient Goals:  "I don't know"  Discharge Plan or Barriers: SPE pamphlet, Mobile Crisis information, and AA/NA information provided to patient for additional community support and resources. Pt reports being unsure about referrals due to no insurance and inability to pay out of pocket.   Reason for Continuation of Hospitalization: Depression Medication stabilization  Estimated Length of Stay: 5-7 days  Recreational Therapy: Patient Stressors: N/A Patient Goal: Patient will engage in groups without prompting or encouragement from LRT x3 group sessions within 5 recreation therapy group sessions  Attendees: Patient:Donna Cisneros 04/06/2018 11:23 AM  Physician: Dr. Jennet Maduro MD 04/06/2018 11:23 AM  Nursing: Mickeal Needy 04/06/2018 11:23 AM  RN Care Manager: 04/06/2018 11:23 AM  Social Worker: Iris Pert, LCSW 04/06/2018 11:23 AM  Recreational Therapist: Garret Reddish CTRS LRT 04/06/2018 11:23 AM  Other: Lowella Dandy LCSW 04/06/2018 11:23 AM  Other:  04/06/2018 11:23 AM  Other: 04/06/2018 11:23 AM    Scribe for Treatment Team: Charlann Lange Moton, LCSW 04/06/2018 11:23 AM
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
## 2018-04-06 NOTE — Progress Notes (Signed)
Recreation Therapy Notes  INPATIENT RECREATION THERAPY ASSESSMENT  Patient Details Name: Donna Cisneros MRN: [id number] DOB: 10-Nov-1990 Today's Date: 04/06/2018       Information Obtained From: Patient  Able to Participate in Assessment/Interview: Yes  Patient Presentation: Responsive  Reason for Admission (Per Patient): Active Symptoms, Suicide Attempt  Patient Stressors: Other (Comment)(Health)  Coping Skills:   TV, Other (Comment)(Clean)  Leisure Interests (2+):  Individual - Reading, Games - Video games  Frequency of Recreation/Participation:    Awareness of Community Resources:     [employer]:     Current Use:    If no, Barriers?:    Expressed Interest in [employer] Information:    [place] of Residence:  [employer]  Patient Main Form of Transportation: Other (Comment)(My husband)  Patient Strengths:  Honest  Patient Identified Areas of Improvement:  Getting my health better  Patient Goal for Hospitalization:  I do not know  Current SI (including self-harm):  No  Current HI:  No  Current AVH: No  Staff Intervention Plan: Group Attendance, Collaborate with Interdisciplinary Treatment Team  Consent to Intern Participation: N/A  Donna Cisneros 04/06/2018, 2:18 PM

## 2018-04-06 NOTE — BHH Suicide Risk Assessment (Signed)
[facility] Admission Suicide Risk Assessment   Nursing information obtained from:  Patient Demographic factors:  Caucasian, Unemployed Current Mental Status:  Self-harm thoughts Loss Factors:  NA Historical Factors:  Prior suicide attempts Risk Reduction Factors:  Living with another person, especially a relative, Positive social support, Positive therapeutic relationship  Total Time spent with patient: 1 hour Principal Problem: Major depressive disorder, recurrent episode, severe (HCC) Diagnosis:  Principal Problem:   Major depressive disorder, recurrent episode, severe (HCC) Active Problems:   Suicide attempt by drug overdose (HCC)  Subjective Data: overdose  Continued Clinical Symptoms:  Alcohol Use Disorder Identification Test Final Score (AUDIT): 8 The "Alcohol Use Disorders Identification Test", Guidelines for Use in Primary Care, Second Edition.  World Science writer [facility]). Score between 0-7:  no or low risk or alcohol related problems. Score between 8-15:  moderate risk of alcohol related problems. Score between 16-19:  high risk of alcohol related problems. Score 20 or above:  warrants further diagnostic evaluation for alcohol dependence and treatment.   CLINICAL FACTORS:   Depression:   Impulsivity Obsessive-Compulsive Disorder Chronic Pain Medical Diagnoses and Treatments/Surgeries   Musculoskeletal: Strength & Muscle Tone: within normal limits Gait & Station: normal Patient leans: N/A  Psychiatric Specialty Exam: Physical Exam  Nursing note and vitals reviewed. Psychiatric: Her speech is normal. Her mood appears anxious. Her affect is blunt. She is withdrawn. Cognition and memory are normal. She expresses impulsivity. She exhibits a depressed mood. She expresses suicidal ideation.    Review of Systems  Neurological: Negative.   Psychiatric/Behavioral: Positive for depression and suicidal ideas. The patient is nervous/anxious.   All other systems reviewed and  are negative.   Blood pressure 101/60, pulse 89, temperature 98.2 F (36.8 C), temperature source Oral, resp. rate 18, height 5\' 1"  (1.549 m), weight 73.9 kg, last menstrual period 03/30/2018, SpO2 99 %.Body mass index is 30.8 kg/m.  General Appearance: Casual  Eye Contact:  Good  Speech:  Clear and Coherent  Volume:  Normal  Mood:  Anxious and Depressed  Affect:  Blunt and Tearful  Thought Process:  Goal Directed and Descriptions of Associations: Intact  Orientation:  Full (Time, Place, and Person)  Thought Content:  WDL  Suicidal Thoughts:  Yes.  with intent/plan  Homicidal Thoughts:  No  Memory:  Immediate;   Fair Recent;   Fair Remote;   Fair  Judgement:  Poor  Insight:  Present  Psychomotor Activity:  Normal  Concentration:  Concentration: Fair and Attention Span: Fair  Recall:  [employer] of Knowledge:  Fair  Language:  Fair  Akathisia:  No  Handed:  Right  AIMS (if indicated):     Assets:  Communication Skills Desire for Improvement Resilience Social Support  ADL's:  Intact  Cognition:  WNL  Sleep:  Number of Hours: 7.75      COGNITIVE FEATURES THAT CONTRIBUTE TO RISK:  None    SUICIDE RISK:   Moderate:  Frequent suicidal ideation with limited intensity, and duration, some specificity in terms of plans, no associated intent, good self-control, limited dysphoria/symptomatology, some risk factors present, and identifiable protective factors, including available and accessible social support.  PLAN OF CARE: hospital admission, mdication management, GI consult, discharge planning.  Ms. Mazyck is a 28 year old female wirh a history of OCD and PTSD admitted after overdose on Felxeril in the context of ongoing medical problems. Overdose was precipitated by a minor argument, over a cat, with her husband.  #Suicidal ideation -patient is able to contract  for safety in the hospital  #Mood/PTSD/OCD -we recommend Luvox and Minipress, patient insists on waiting four  more days to complete one month course of Carafate (OCD)  #GI problems -GI consult is greatly appreciated  #Disposition -discharge to home with the husband -follow up with her regular doctor  I certify that inpatient services furnished can reasonably be expected to improve the patient's condition.   Kristine Linea, MD 04/06/2018, 10:20 AM

## 2018-04-07 MED ORDER — ENSURE ENLIVE PO LIQD
237.0000 mL | Freq: Two times a day (BID) | ORAL | Status: DC
  Administered 2018-04-07 – 2018-04-08 (×3): 237 mL via ORAL

## 2018-04-07 MED ORDER — ADULT MULTIVITAMIN W/MINERALS CH
1.0000 | ORAL_TABLET | Freq: Every day | ORAL | Status: DC
  Administered 2018-04-08: 1 via ORAL
  Filled 2018-04-07: qty 1

## 2018-04-07 MED ORDER — FLUVOXAMINE MALEATE 50 MG PO TABS
50.0000 mg | ORAL_TABLET | Freq: Every day | ORAL | Status: DC
  Administered 2018-04-07: 50 mg via ORAL
  Filled 2018-04-07: qty 1

## 2018-04-07 MED ORDER — PRAZOSIN HCL 1 MG PO CAPS
1.0000 mg | ORAL_CAPSULE | Freq: Once | ORAL | Status: AC
  Administered 2018-04-07: 1 mg via ORAL
  Filled 2018-04-07: qty 1

## 2018-04-07 NOTE — Progress Notes (Signed)
D - Patient was in her room upon arrival to the unit. Patient was upset during assessment and medication administration. Patient denies SI/HI/AVH and pain. Patient rated hr dep and anxiety 8/10. See MAR. Patient was tearful stating, "I want to go home and be with my husband and my cats. I don't know why I can't just go." Patient given education.   A - Patient compliant with medication administration per MD orders and procedures on the unit. Patient given education. Patient given support and encouragement to be active in her treatment plan. Patient informed to let staff know if there are any issues or problems on the unit.   R - Patient being monitored Q 15 minutes for safety per unit protocol. Patient remains safe on the unit.

## 2018-04-07 NOTE — Progress Notes (Signed)
Recreation Therapy Notes  INPATIENT RECREATION TR PLAN  Patient Details Name: Kaili Castille MRN: [id number] DOB: 1990/05/21 Today's Date: 04/07/2018  Rec Therapy Plan Is patient appropriate for Therapeutic Recreation?: Yes Treatment times per week: at least 3 Estimated Length of Stay: 5-7 days TR Treatment/Interventions: Group participation (Comment)  Discharge Criteria Pt will be discharged from therapy if:: Discharged Treatment plan/goals/alternatives discussed and agreed upon by:: Patient/family  Discharge Summary Short term goals set: Patient will engage in groups without prompting or encouragement from LRT x3 group sessions within 5 recreation therapy group sessions Short term goals met: Adequate for discharge Progress toward goals comments: Groups attended Which groups?: Other (Comment)(Relaxation) Reason goals not met: N/A Therapeutic equipment acquired: N/A Reason patient discharged from therapy: Discharge from hospital Pt/family agrees with progress & goals achieved: Yes Date patient discharged from therapy: 04/07/18   Marleena Shubert 04/07/2018, 11:18 AM

## 2018-04-07 NOTE — Plan of Care (Signed)
Patient complaint with medication administration, per MD orders. Patient stated to this writer, "I am feeling better and I am ready to go, hopefully I can leave tomorrow."    Problem: Health Behavior/Discharge Planning: Goal: Compliance with therapeutic regimen will improve Outcome: Progressing   Problem: Education: Goal: Emotional status will improve Outcome: Progressing

## 2018-04-07 NOTE — Plan of Care (Signed)
Patient is alert and oriented; very irritable about diet order (Clear liquid). Complain this morning of gas and bloating and received ma lox to help with symptoms. Patient received shower supplies and encouraged to shower, patient remains disheveled with bad body odor. Patient does go to groups and participate. Most of the time patient is compliant with medications. Patient will continue to have 15 minute safety checks. Problem: Education: Goal: Utilization of techniques to improve thought processes will improve Outcome: Progressing Goal: Knowledge of the prescribed therapeutic regimen will improve Outcome: Progressing   Problem: Activity: Goal: Interest or engagement in leisure activities will improve Outcome: Progressing Goal: Imbalance in normal sleep/wake cycle will improve Outcome: Progressing   Problem: Coping: Goal: Coping ability will improve Outcome: Not Progressing Goal: Will verbalize feelings Outcome: Progressing   Problem: Health Behavior/Discharge Planning: Goal: Ability to make decisions will improve Outcome: Progressing Goal: Compliance with therapeutic regimen will improve Outcome: Progressing

## 2018-04-07 NOTE — Progress Notes (Signed)
[facility] MD Progress Note  04/07/2018 11:40 AM Donna Cisneros  MRN:  [id number]  Subjective:    Donna Cisneros  Met with Dr. Alice Reichert GI and is disappointed that his recommendation is in keping with her PCP and includes recommendation of liquid diet and fiber. She opposes liquid diet after she took "few sips". She did change her mind about Carafate and is now ready to give it up and start antidepressant. Start Luvox tonight and give single dose of minipress now.  Spoke with the husband with patient's permission.   Principal Problem: Major depressive disorder, recurrent episode, severe (HCC) Diagnosis: Principal Problem:   Major depressive disorder, recurrent episode, severe ([place]) Active Problems:   Suicide attempt by drug overdose ([place])   Tobacco use disorder   OCD (obsessive compulsive disorder)   PTSD (post-traumatic stress disorder)  Total Time spent with patient: 20 minutes  Past Psychiatric History: depression, anxiety  Past Medical History:  Past Medical History:  Diagnosis Date  . Acid reflux   . Amenorrhea 01/24/2013  . Anxiety   . Irregular menstrual cycle 01/24/2013  . Major depressive disorder, recurrent episode, severe ([place]) 04/05/2018  . PCOS (polycystic ovarian syndrome)   . PCOS (polycystic ovarian syndrome) 08/18/2016  . Stomach ulcer     Past Surgical History:  Procedure Laterality Date  . CHOLECYSTECTOMY     Family History:  Family History  Problem Relation Age of Onset  . Heart disease Maternal Grandfather    Family Psychiatric  History: none Social History:  Social History   Substance and Sexual Activity  Alcohol Use Yes   Comment: occassionally     Social History   Substance and Sexual Activity  Drug Use No    Social History   Socioeconomic History  . Marital status: Married    Spouse name: Not on file  . Number of children: Not on file  . Years of education: Not on file  . Highest education level: Not on file  Occupational History  . Not  on file  Social Needs  . Financial resource strain: Not on file  . Food insecurity:    Worry: Not on file    Inability: Not on file  . Transportation needs:    Medical: Not on file    Non-medical: Not on file  Tobacco Use  . Smoking status: Current Every Day Smoker    Packs/day: 0.50    Years: 11.00    Pack years: 5.50    Types: Cigarettes  . Smokeless tobacco: Never Used  Substance and Sexual Activity  . Alcohol use: Yes    Comment: occassionally  . Drug use: No  . Sexual activity: Yes    Birth control/protection: Pill  Lifestyle  . Physical activity:    Days per week: Not on file    Minutes per session: Not on file  . Stress: Not on file  Relationships  . Social connections:    Talks on phone: Not on file    Gets together: Not on file    Attends religious service: Not on file    Active member of club or organization: Not on file    Attends meetings of clubs or organizations: Not on file    Relationship status: Not on file  Other Topics Concern  . Not on file  Social History Narrative  . Not on file   Additional Social History:  Sleep: Fair  Appetite:  Poor  Current Medications: Current Facility-Administered Medications  Medication Dose Route Frequency Provider Last Rate Last Dose  . acetaminophen (TYLENOL) tablet 650 mg  650 mg Oral Q6H PRN Lavella Hammock, MD      . alum & mag hydroxide-simeth (MAALOX/MYLANTA) 200-200-20 MG/5ML suspension 30 mL  30 mL Oral Q4H PRN Lavella Hammock, MD   30 mL at 04/07/18 0759  . diphenhydrAMINE (BENADRYL) capsule 25 mg  25 mg Oral QHS PRN Lavella Hammock, MD      . fluvoxaMINE (LUVOX) tablet 50 mg  50 mg Oral QHS Kelby Lotspeich B, MD      . hydrOXYzine (ATARAX/VISTARIL) tablet 25 mg  25 mg Oral Q6H PRN Lavella Hammock, MD   25 mg at 04/06/18 2137  . levonorgestrel-ethinyl estradiol (NORDETTE) 0.15-30 MG-MCG per tablet 1 tablet  1 tablet Oral Daily Jacquie Lukes B, MD   1 tablet  at 04/07/18 0757  . magnesium hydroxide (MILK OF MAGNESIA) suspension 30 mL  30 mL Oral Daily PRN Lavella Hammock, MD      . polycarbophil [facility]) tablet 1,250 mg  1,250 mg Oral Daily Efrain Sella, MD   1,250 mg at 04/07/18 0757  . prazosin (MINIPRESS) capsule 1 mg  1 mg Oral Once Toshio Slusher B, MD        Lab Results: No results found for this or any previous visit (from the past 48 hour(s)).  Blood Alcohol level:  Lab Results  Component Value Date   ETH <10 40/98/1191    Metabolic Disorder Labs: No results found for: HGBA1C, MPG No results found for: PROLACTIN No results found for: CHOL, TRIG, HDL, CHOLHDL, VLDL, LDLCALC  Physical Findings: AIMS: Facial and Oral Movements Muscles of Facial Expression: None, normal Lips and Perioral Area: None, normal Jaw: None, normal Tongue: None, normal,Extremity Movements Upper (arms, wrists, hands, fingers): None, normal Lower (legs, knees, ankles, toes): None, normal, Trunk Movements Neck, shoulders, hips: None, normal, Overall Severity Severity of abnormal movements (highest score from questions above): None, normal Incapacitation due to abnormal movements: None, normal Patient's awareness of abnormal movements (rate only patient's report): No Awareness, Dental Status Current problems with teeth and/or dentures?: No Does patient usually wear dentures?: No  CIWA:    COWS:     Musculoskeletal: Strength & Muscle Tone: within normal limits Gait & Station: normal Patient leans: N/A  Psychiatric Specialty Exam: Physical Exam  Nursing note and vitals reviewed. Psychiatric: Her speech is normal. Thought content normal. Her mood appears anxious. Her affect is blunt. She is withdrawn. Cognition and memory are normal. She expresses impulsivity. She exhibits a depressed mood.    Review of Systems  Gastrointestinal: Positive for abdominal pain and constipation.  Neurological: Negative.   Psychiatric/Behavioral: Positive for  depression. The patient is nervous/anxious.   All other systems reviewed and are negative.   Blood pressure 111/61, pulse (!) 124, temperature 98.2 F (36.8 C), temperature source Oral, resp. rate 18, height _0  (1.549 m), weight 73.9 kg, last menstrual period 03/30/2018, SpO2 98 %.Body mass index is 30.8 kg/m.  General Appearance: Disheveled  Eye Contact:  Good  Speech:  Clear and Coherent  Volume:  Normal  Mood:  Anxious  Affect:  Flat  Thought Process:  Goal Directed and Descriptions of Associations: Intact  Orientation:  Full (Time, Place, and Person)  Thought Content:  WDL  Suicidal Thoughts:  No  Homicidal Thoughts:  No  Memory:  Immediate;  Fair Recent;   Fair Remote;   Fair  Judgement:  Poor  Insight:  Lacking  Psychomotor Activity:  Decreased  Concentration:  Concentration: Fair and Attention Span: Fair  Recall:  [employer] of Knowledge:  Fair  Language:  Fair  Akathisia:  No  Handed:  Right  AIMS (if indicated):     Assets:  Communication Skills Desire for Improvement Housing Resilience Social Support  ADL's:  Intact  Cognition:  WNL  Sleep:  Number of Hours: 7     Treatment Plan Summary: Daily contact with patient to assess and evaluate symptoms and progress in treatment and Medication management   Donna Cisneros is a 28 year old female wirh a history of OCD and PTSD admitted after overdose on Felxeril in the context of ongoing medical problems. Overdose was precipitated by a minor argument, over a cat, with her husband.  #Suicidal ideation -patient is able to contract for safety in the hospital  #Mood/PTSD/OCD -start Luvox 50 mg nightly -Minipress 1 mg now to see if tolerated -she has prescription for Wellbutrin from her PCP to address depression and smoking cessation  cessation Wellbutrin not the best choice for high anxiety  #GI problems -GI consult is greatly appreciated -Fibercon  #Smoking cessation -nicotine patch was  offered  #Disposition -discharge to home with the husband -follow up with her regular doctor  Orson Slick, MD 04/07/2018, 11:40 AM

## 2018-04-07 NOTE — BHH Group Notes (Signed)
LCSW Group Therapy Note  04/07/2018 1:00 PM  Type of Therapy/Topic:  Group Therapy:  Balance in Life  Participation Level:  Minimal  Description of Group:    This group will address the concept of balance and how it feels and looks when one is unbalanced. Patients will be encouraged to process areas in their lives that are out of balance and identify reasons for remaining unbalanced. Facilitators will guide patients in utilizing problem-solving interventions to address and correct the stressor making their life unbalanced. Understanding and applying boundaries will be explored and addressed for obtaining and maintaining a balanced life. Patients will be encouraged to explore ways to assertively make their unbalanced needs known to significant others in their lives, using other group members and facilitator for support and feedback.  Therapeutic Goals: 1. Patient will identify two or more emotions or situations they have that consume much of in their lives. 2. Patient will identify signs/triggers that life has become out of balance:  3. Patient will identify two ways to set boundaries in order to achieve balance in their lives:  4. Patient will demonstrate ability to communicate their needs through discussion and/or role plays  Summary of Patient Progress: Patient was present for group and appeared to be attentive.  Patient did not discuss issues in group or participate in any discussion beyond to state that she felt being imbalanced "everywhere".    Therapeutic Modalities:   Cognitive Behavioral Therapy Solution-Focused Therapy Assertiveness Training  Penni Homans MSW, LCSW 04/07/2018 2:12 PM

## 2018-04-07 NOTE — Progress Notes (Signed)
Initial Nutrition Assessment  DOCUMENTATION CODES:   Not applicable  INTERVENTION:   Ensure Enlive po BID, each supplement provides 350 kcal and 20 grams of protein  MVI daily   Bottled water with meal trays   Diet education   NUTRITION DIAGNOSIS:   Inadequate oral intake related to altered GI function as evidenced by per patient/family report.  GOAL:   Patient will meet greater than or equal to 90% of their needs  MONITOR:   PO intake, Supplement acceptance  REASON FOR ASSESSMENT:   Consult Assessment of nutrition requirement/status  ASSESSMENT:   28 year old female with a history of OCD and PTSD, PCOS, gastric ulcer admitted after overdose on Felxeril in the context of ongoing medical problems.    Met with pt in room today. Pt reports a long history of "IBS like" symptoms going back as long as she can remember. Pt reports chronic constipation, bloating and gas for many years now. Pt reports intermittent nausea and vomiting, mainly when she is "backed up". Pt does report a history of bulimia nervosa as a child; pt reports this is resolved now. Pt also reports a h/o gastric ulcer and severe gastric reflux for which she drinks alkaline (Fuji) water for. Pt reports that she has been checked for H. Pylori in the past and this was negative. Pt reports times when she does not have any symptoms but reports that most of the time she feels either constipated or bloated. Pt can correlate worsening of symptoms with certain foods. Pt describes her stools as being hard and dry. Pt reports having to "strain and work hard" to pass stools. Pt does take simethicone sometimes for gas but reports this doesn't help much. Pt with h/o PCOS. A high percentage of patients with PCOS experience IBS symptoms. Pt reports improvement of constipation during menstruation. Pt with h/o cholecystectomy but reports symptoms started long before this surgery.   Pt reports good appetite and oral intake at baseline  but reports she avoids many foods out of fear that it will worsen her symptoms. RD discussed with pt today the different kinds of fiber and recommended a diet higher in insoluble fiber vs soluble fiber. Recommended that pt keep a food journal where she documents all food intake as well as any signs and symptoms that occur after eating. Advised pt not to develop a habit of avoiding a certain type of food every time she has a symptom but rather to eliminate foods that consistently cause worsening of her symptoms. Recommend Ensure or some kind of protein supplement at times when her appetite is not good; pt reports drinking Slim Fast at home. RD provided pt with handouts describing the different kinds of fiber and examples of each. RD also provided a list of FODMAP foods and recommended pt avoid these foods; pt already avoids garlic as this worsens her symptoms. RD also recommended foods rich in probiotics as these foods will support gut health and aid in digestion of fiber. Pt started on fibercon this admit and reports that this seemed to help her symptoms. Recommended avoiding excess caffeine and spicy foods. Pt reports rarely drinking alcohol. Recommend avoiding "sugar free" foods.  Recommend daily MVI. Recommend drinking plenty of water; alkaline water is actually preferred and research supports the use of alkaline water to relieve heartburn symptoms.   RD will add Ensure supplements and MVI daily. RD will also provide pt with bottled water on meal trays.   Pt reports her weight is stable pta  Medications reviewed and include: fibercon  Labs reviewed: BUN 5(L)- 3/2 Wbc- 15.1(H)- 3/2  Diet Order:   Diet Order            Diet regular Room service appropriate? Yes; Fluid consistency: Thin  Diet effective now             EDUCATION NEEDS:   Education needs have been addressed  Skin:  Skin Assessment: Reviewed RN Assessment  Last BM:  consitpation   Height:   Ht Readings from Last 1  Encounters:  04/05/18 5' 1"  (1.549 m)    Weight:   Wt Readings from Last 1 Encounters:  04/05/18 73.9 kg    Ideal Body Weight:  47.7 kg  BMI:  Body mass index is 30.8 kg/m.  Estimated Nutritional Needs:   Kcal:  1500-1700kcal/day   Protein:  74-82g/day   Fluid:  1.4L/day   Koleen Distance MS, RD, LDN Pager #- [phone] Office#- [phone] After Hours Pager: [phone]

## 2018-04-07 NOTE — Progress Notes (Signed)
D - Patient was in her room upon arrival to the unit. Patient was upset during assessment and medication administration. Patient denies SI/HI/AVH and pain. Patient denies anxiety and depression. Patient presented with a better affect this evening and stated to this writer, "I am feeling better, I hope I can go home tomorrow."   A - Patient compliant with medication administration per MD orders and procedures on the unit. Patient given education. Patient given support and encouragement to be active in her treatment plan. Patient informed to let staff know if there are any issues or problems on the unit.   R - Patient being monitored Q 15 minutes for safety per unit protocol. Patient remains safe on the unit.

## 2018-04-07 NOTE — Plan of Care (Signed)
Patient was compliant with medication administration per MD orders and procedures on the unit. Patient was isolative to her room other than coming out for medications or talking on the phone.    Problem: Health Behavior/Discharge Planning: Goal: Compliance with therapeutic regimen will improve Outcome: Progressing   Problem: Role Relationship: Goal: Will demonstrate positive changes in social behaviors and relationships Outcome: Not Progressing

## 2018-04-07 NOTE — Progress Notes (Signed)
Recreation Therapy Notes  Date: 04/07/2018  Time: 9:30 am  Location: Craft Room  Behavioral response: Appropriate  Intervention Topic: Relaxation   Discussion/Intervention:  Group content today was focused on relaxation. The group defined relaxation and identified healthy ways to relax. Individuals expressed how much time they spend relaxing. Patients expressed how much their life would be if they did not make time for themselves to relax. The group stated ways they could improve their relaxation techniques in the future.  Individuals participated in the intervention "Time to Relax" where they had a chance to experience different relaxation techniques.  Clinical Observations/Feedback:  Patient came to group and was focused on what peers and staff had to say about relaxation. She participated in the intervention.  Broly Hatfield LRT/CTRS         Caidynce Muzyka 04/07/2018 11:01 AM

## 2018-04-08 DIAGNOSIS — K589 Irritable bowel syndrome without diarrhea: Secondary | ICD-10-CM | POA: Diagnosis present

## 2018-04-08 MED ORDER — FLUVOXAMINE MALEATE 50 MG PO TABS: 100.0000 mg | ORAL_TABLET | Freq: Every day | ORAL | Status: DC

## 2018-04-08 MED ORDER — FLUVOXAMINE MALEATE 100 MG PO TABS: 100.0000 mg | ORAL_TABLET | Freq: Every day | ORAL | 1 refills | Status: AC

## 2018-04-08 MED ORDER — ADULT MULTIVITAMIN W/MINERALS CH: 1.0000 | ORAL_TABLET | Freq: Every day | ORAL | 1 refills | Status: AC

## 2018-04-08 MED ORDER — PRAZOSIN HCL 1 MG PO CAPS
1.0000 mg | ORAL_CAPSULE | Freq: Two times a day (BID) | ORAL | Status: DC
  Administered 2018-04-08 (×2): 1 mg via ORAL
  Filled 2018-04-08 (×2): qty 1

## 2018-04-08 MED ORDER — PRAZOSIN HCL 1 MG PO CAPS: 1.0000 mg | ORAL_CAPSULE | Freq: Two times a day (BID) | ORAL | 1 refills | Status: AC

## 2018-04-08 MED ORDER — CALCIUM POLYCARBOPHIL 625 MG PO TABS: 1250.0000 mg | ORAL_TABLET | Freq: Every day | ORAL | 1 refills | Status: AC

## 2018-04-08 NOTE — Progress Notes (Signed)
Recreation Therapy Notes   Date: 04/08/2018  Time: 9:30 am   Location: Craft room   Behavioral response: N/A   Intervention Topic: Leisure  Discussion/Intervention: Patient did not attend group.   Clinical Observations/Feedback:  Patient did not attend group.   Alexei Ey LRT/CTRS        Jenniger Figiel 04/08/2018 11:28 AM
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
## 2018-04-08 NOTE — Discharge Planning (Signed)
Patient is alert and oriented X 4, denies SI, HI and AVH. Patient rates pain 0/10. Patient received belongings from locker and 7 day supply and paper prescriptions. Patient escorted to visitors entrance where husband waited to take patient home in personal vehicle.

## 2018-04-08 NOTE — BHH Suicide Risk Assessment (Signed)
[facility] Discharge Suicide Risk Assessment   Principal Problem: Major depressive disorder, recurrent episode, severe (HCC) Discharge Diagnoses: Principal Problem:   Major depressive disorder, recurrent episode, severe (HCC) Active Problems:   Suicide attempt by drug overdose (HCC)   Tobacco use disorder   OCD (obsessive compulsive disorder)   PTSD (post-traumatic stress disorder)   Total Time spent with patient: 20 minutes  Musculoskeletal: Strength & Muscle Tone: within normal limits Gait & Station: normal Patient leans: N/A  Psychiatric Specialty Exam: Review of Systems  Gastrointestinal: Positive for abdominal pain.  Neurological: Negative.   Psychiatric/Behavioral: Negative.   All other systems reviewed and are negative.   Blood pressure 115/68, pulse (!) 107, temperature 98.1 F (36.7 C), temperature source Oral, resp. rate 18, height 5\' 1"  (1.549 m), weight 73.9 kg, last menstrual period 03/30/2018, SpO2 99 %.Body mass index is 30.8 kg/m.  General Appearance: Casual  Eye Contact::  Good  Speech:  Clear and Coherent409  Volume:  Normal  Mood:  Anxious  Affect:  Appropriate  Thought Process:  Goal Directed and Descriptions of Associations: Intact  Orientation:  Full (Time, Place, and Person)  Thought Content:  WDL  Suicidal Thoughts:  No  Homicidal Thoughts:  No  Memory:  Immediate;   Fair Recent;   Fair Remote;   Fair  Judgement:  Impaired  Insight:  Present  Psychomotor Activity:  Normal  Concentration:  Fair  Recall:  [employer] of Knowledge:Fair  Language: Fair  Akathisia:  No  Handed:  Right  AIMS (if indicated):     Assets:  Communication Skills Desire for Improvement Housing Intimacy Resilience Social Support  Sleep:  Number of Hours: 6.75  Cognition: WNL  ADL's:  Intact   Mental Status Per Nursing Assessment::   On Admission:  Self-harm thoughts  Demographic Factors:  Caucasian and Unemployed  Loss Factors: Decrease in vocational status,  Decline in physical health and Financial problems/change in socioeconomic status  Historical Factors: Prior suicide attempts and Impulsivity  Risk Reduction Factors:   Sense of responsibility to family, Living with another person, especially a relative, Positive social support and Positive therapeutic relationship  Continued Clinical Symptoms:  Severe Anxiety and/or Agitation Depression:   Impulsivity Medical Diagnoses and Treatments/Surgeries  Cognitive Features That Contribute To Risk:  None    Suicide Risk:  Minimal: No identifiable suicidal ideation.  Patients presenting with no risk factors but with morbid ruminations; may be classified as minimal risk based on the severity of the depressive symptoms  Follow-up Information    [facility] Follow up.   Why:  If you decide to pursue mental health treament, call and schedule an appointment. You will have to bring a photo id, and proof of income to determine cost of services. You may call ahead with the income information to get a price range. Thank you! Contact information: [address] [place] [address] [phone]           Plan Of Care/Follow-up recommendations:  Activity:  as tolerated Diet:  high fiber Other:  keep follow up appointments  Kristine Linea, MD 04/08/2018, 11:35 AM

## 2018-04-08 NOTE — Progress Notes (Signed)
  [facility] Adult Case Management Discharge Plan :  Will you be returning to the same living situation after discharge:  Yes,  home with husband At discharge, do you have transportation home?: Yes,  husband Do you have the ability to pay for your medications: Yes,  mental health  Release of information consent forms completed and in the chart;  Patient to Follow up at: Follow-up Information    [facility] Follow up.   Why:  If you decide to pursue mental health treament, call and schedule an appointment. You will have to bring a photo id, and proof of income to determine cost of services. You may call ahead with the income information to get a price range. Thank you! Contact information: [address] [place] [address] [phone]           Next level of care provider has access to [facility] Link:no  Safety Planning and Suicide Prevention discussed: Yes,  SPE completed with pt as pt declined colalteral contact  Have you used any form of tobacco in the last 30 days? (Cigarettes, Smokeless Tobacco, Cigars, and/or Pipes): Yes  Has patient been referred to the Quitline?: Patient refused referral  Patient has been referred for addiction treatment: N/A  Mechele Dawley, LCSW 04/08/2018, 11:24 AM

## 2018-04-08 NOTE — Plan of Care (Signed)
Patient is alert and oriented. Denies SI, HI and AVH. Patient is animated this morning. Patient discussed hygiene insecurity, and showering in "strange places." RN provided showering shoes for patient. Patient states she received medication last night which made her feel a "little different today was the only day I forgot to fill out my menu."  I think I am fine to go home but I don't know because I started new medication but I was already somewhere else for 24 hours before I came here." Patient not having any self harming actions. Problem: Education: Goal: Utilization of techniques to improve thought processes will improve Outcome: Progressing Goal: Knowledge of the prescribed therapeutic regimen will improve Outcome: Progressing   Problem: Activity: Goal: Interest or engagement in leisure activities will improve Outcome: Progressing Goal: Imbalance in normal sleep/wake cycle will improve Outcome: Progressing   Problem: Coping: Goal: Coping ability will improve Outcome: Progressing Goal: Will verbalize feelings Outcome: Progressing   Problem: Health Behavior/Discharge Planning: Goal: Ability to make decisions will improve Outcome: Progressing Goal: Compliance with therapeutic regimen will improve Outcome: Progressing

## 2023-02-07 ENCOUNTER — Emergency Department (HOSPITAL_BASED_OUTPATIENT_CLINIC_OR_DEPARTMENT_OTHER)
Admission: EM | Admit: 2023-02-07 | Discharge: 2023-02-07 | Disposition: A | Discharge status: Home / Self Care | Payer: Medicaid Other | Attending: Emergency Medicine | Admitting: Emergency Medicine

## 2023-02-07 ENCOUNTER — Encounter (HOSPITAL_BASED_OUTPATIENT_CLINIC_OR_DEPARTMENT_OTHER): Payer: Self-pay | Admitting: Emergency Medicine

## 2023-02-07 ENCOUNTER — Other Ambulatory Visit: Payer: Self-pay

## 2023-02-07 DIAGNOSIS — R55 Syncope and collapse: Secondary | ICD-10-CM

## 2023-02-07 DIAGNOSIS — R350 Frequency of micturition: Secondary | ICD-10-CM | POA: Insufficient documentation

## 2023-02-07 DIAGNOSIS — R0981 Nasal congestion: Secondary | ICD-10-CM | POA: Insufficient documentation

## 2023-02-07 DIAGNOSIS — Z20822 Contact with and (suspected) exposure to covid-19: Secondary | ICD-10-CM | POA: Insufficient documentation

## 2023-02-07 HISTORY — DX: Prediabetes: R73.03

## 2023-02-07 HISTORY — DX: Constipation, unspecified: K59.00

## 2023-02-07 HISTORY — DX: Irritable bowel syndrome, unspecified: K58.9

## 2023-02-07 LAB — URINALYSIS, ROUTINE W REFLEX MICROSCOPIC
Bilirubin Urine: NEGATIVE
Glucose, UA: NEGATIVE mg/dL
Hgb urine dipstick: NEGATIVE
Ketones, ur: NEGATIVE mg/dL
Leukocytes,Ua: NEGATIVE
Nitrite: NEGATIVE
Protein, ur: 100 mg/dL — AB
Specific Gravity, Urine: >=1.03 (ref 1.005–1.030)
pH: 5.5 (ref 5.0–8.0)

## 2023-02-07 LAB — RESP PANEL BY RT-PCR (RSV, FLU A&B, COVID)  RVPGX2
Influenza A by PCR: NEGATIVE
Influenza B by PCR: NEGATIVE
Resp Syncytial Virus by PCR: NEGATIVE
SARS Coronavirus 2 by RT PCR: NEGATIVE

## 2023-02-07 LAB — URINALYSIS, MICROSCOPIC (REFLEX)

## 2023-02-07 LAB — PREGNANCY, URINE: Preg Test, Ur: NEGATIVE

## 2023-02-07 NOTE — ED Notes (Signed)
BS 127

## 2023-02-07 NOTE — ED Triage Notes (Signed)
 Pt came to ED with another patient.  Pt had near syncopal event in the room when bringing patient to a room.  Pt admits she hasn't gotten enough sleep and took Nyquil at 0513.

## 2023-02-07 NOTE — ED Provider Notes (Signed)
 [place] [facility] Provider Note   CSN: [id number] Arrival date & time: 02/07/23  9182     History  Chief Complaint  Patient presents with   Near Syncope    Donna Cisneros is a 33 y.o. female.  Patient is a 33 year old female who presents with a near syncopal event.  She was actually here with another patient and got dizzy and lightheaded and felt like she might pass out.  She was placed on a stretcher.  She says she feels back to normal now.  She said that she only got about 2 hours of sleep last night and she took some NyQuil around 5 AM this morning.  She feels like this is the reason that she is feeling lightheaded.  She denies any chest pain or shortness of breath.  No palpitations.  She has had a little bit of nasal congestion but no other recent illnesses.  No shortness of breath.  No fevers.  No urinary symptoms other than she has a little bit of urinary frequency.  No abdominal pain.  No vomiting or diarrhea other than she has chronic constipation/diarrhea and possibly has IBS.  However the symptoms are unchanged.       Home Medications Prior to Admission medications   Not on File      Allergies    Metformin and related and Nsaids    Review of Systems   Review of Systems  Constitutional:  Positive for fatigue. Negative for chills, diaphoresis and fever.  HENT:  Positive for congestion and rhinorrhea. Negative for sneezing.   Eyes: Negative.   Respiratory:  Negative for cough, chest tightness and shortness of breath.   Cardiovascular:  Negative for chest pain and leg swelling.  Gastrointestinal:  Positive for diarrhea. Negative for abdominal pain, blood in stool, nausea and vomiting.  Genitourinary:  Negative for difficulty urinating, flank pain, frequency and hematuria.  Musculoskeletal:  Negative for arthralgias and back pain.  Skin:  Negative for rash.  Neurological:  Positive for light-headedness. Negative for speech  difficulty, weakness, numbness and headaches.    Physical Exam Updated Vital Signs BP 103/86   Pulse 83   Temp 98.3 F (36.8 C) (Oral)   Resp 16   Ht 5' (1.524 m)   Wt 70 kg   LMP 12/07/2022 (Approximate)   SpO2 100%   BMI 30.14 kg/m  Physical Exam Constitutional:      Appearance: She is well-developed.  HENT:     Head: Normocephalic and atraumatic.  Eyes:     Pupils: Pupils are equal, round, and reactive to light.  Cardiovascular:     Rate and Rhythm: Normal rate and regular rhythm.     Heart sounds: Normal heart sounds.  Pulmonary:     Effort: Pulmonary effort is normal. No respiratory distress.     Breath sounds: Normal breath sounds. No wheezing or rales.  Chest:     Chest wall: No tenderness.  Abdominal:     General: Bowel sounds are normal.     Palpations: Abdomen is soft.     Tenderness: There is no abdominal tenderness. There is no guarding or rebound.  Musculoskeletal:        General: Normal range of motion.     Cervical back: Normal range of motion and neck supple.  Lymphadenopathy:     Cervical: No cervical adenopathy.  Skin:    General: Skin is warm and dry.     Findings: No rash.  Neurological:     General: No focal deficit present.     Mental Status: She is alert and oriented to person, place, and time.     ED Results / Procedures / Treatments   Labs (all labs ordered are listed, but only abnormal results are displayed) Labs Reviewed  URINALYSIS, ROUTINE W REFLEX MICROSCOPIC - Abnormal; Notable for the following components:      Result Value   Protein, ur 100 (*)    All other components within normal limits  URINALYSIS, MICROSCOPIC (REFLEX) - Abnormal; Notable for the following components:   Bacteria, UA MANY (*)    All other components within normal limits  RESP PANEL BY RT-PCR (RSV, FLU A&B, COVID)  RVPGX2  PREGNANCY, URINE  CBG MONITORING, ED    EKG EKG Interpretation Date/Time:  Sunday February 07 2023 09:19:20 EST Ventricular Rate:   84 PR Interval:  123 QRS Duration:  78 QT Interval:  361 QTC Calculation: 427 R Axis:   39  Text Interpretation: Sinus rhythm Low voltage, precordial leads Confirmed by Lenor Hollering ([id number]) on 02/07/2023 9:22:39 AM  Radiology No results found.  Procedures Procedures    Medications Ordered in ED Medications - No data to display  ED Course/ Medical Decision Making/ A&P                                 Medical Decision Making Amount and/or Complexity of Data Reviewed Labs: ordered.   Patient is a 33 year old female who presents after near syncopal episode while here in the ED visiting another patient.  She feels strongly that it is related to her only getting 2 hours of sleep and taking some NyQuil this morning.  She initially refused any evaluation.  However then she was amenable to checking some urine and a CBG.  Her CBG was 127.  Her urinalysis is not consistent with infection.  Her pregnancy test is negative.  She declined blood work.  Her COVID/flu swab was negative.  She declined IV fluids.  She feels asymptomatic now.  She is able to ambulate without symptoms.  I did not see any arrhythmias on her EKG.  No symptoms that would be more concerning for ACS.  No neurologic deficits.  She was discharged home in good condition.  Return precautions were given.  Final Clinical Impression(s) / ED Diagnoses Final diagnoses:  Near syncope    Rx / DC Orders ED Discharge Orders     None         Lenor Hollering, MD 02/07/23 1115

## 2023-02-07 NOTE — ED Notes (Signed)
 ED Provider at bedside.

## 2023-02-08 ENCOUNTER — Encounter: Payer: Self-pay | Admitting: *Deleted

## 2023-02-08 LAB — CBG MONITORING, ED: Glucose-Capillary: 127 mg/dL — ABNORMAL HIGH (ref 70–99)
